# Patient Record
Sex: Female | Born: 1985 | Race: White | Hispanic: No | Marital: Married | Smoking: Never smoker
Health system: Southern US, Community
[De-identification: ages and names within clinical notes are randomized; demographics above are authoritative.]

## PROBLEM LIST (undated history)

## (undated) DIAGNOSIS — E282 Polycystic ovarian syndrome: Secondary | ICD-10-CM

## (undated) DIAGNOSIS — R569 Unspecified convulsions: Secondary | ICD-10-CM

## (undated) DIAGNOSIS — O24419 Gestational diabetes mellitus in pregnancy, unspecified control: Secondary | ICD-10-CM

## (undated) HISTORY — PX: NO PAST SURGERIES: SHX2092

## (undated) HISTORY — DX: Gestational diabetes mellitus in pregnancy, unspecified control: O24.419

---

## 2015-11-09 ENCOUNTER — Ambulatory Visit
Admission: EM | Admit: 2015-11-09 | Discharge: 2015-11-09 | Disposition: A | Discharge status: Home / Self Care | Payer: BLUE CROSS/BLUE SHIELD | Attending: Family Medicine | Admitting: Family Medicine

## 2015-11-09 DIAGNOSIS — B2790 Infectious mononucleosis, unspecified without complication: Secondary | ICD-10-CM

## 2015-11-09 HISTORY — DX: Polycystic ovarian syndrome: E28.2

## 2015-11-09 LAB — MONONUCLEOSIS SCREEN: MONO SCREEN: NEGATIVE

## 2015-11-09 LAB — RAPID STREP SCREEN (MED CTR MEBANE ONLY): STREPTOCOCCUS, GROUP A SCREEN (DIRECT): NEGATIVE

## 2015-11-09 MED ORDER — ACYCLOVIR 200 MG PO CAPS: 200.0000 mg | ORAL_CAPSULE | Freq: Two times a day (BID) | ORAL | 0 refills | Status: DC

## 2015-11-09 NOTE — ED Provider Notes (Signed)
CSN: [id number]     Arrival date & time 11/09/15  [id number] History   First MD Initiated Contact with Patient 11/09/15 [phone]     Chief Complaint  Patient presents with  . Fatigue   (Consider location/radiation/quality/duration/timing/severity/associated sxs/prior Treatment) For 3 days now pt feels tired, sore throat, generalize body aches. 3 other family members have recently been tested positive for mono. She has been running a fever took tyelnol PTA. Denies any abd pain, no n/v/d. Denies any rash.    The history is provided by the patient.    Past Medical History:  Diagnosis Date  . PCOS (polycystic ovarian syndrome)    Past Surgical History:  Procedure Laterality Date  . NO PAST SURGERIES     History reviewed. No pertinent family history. Social History  Substance Use Topics  . Smoking status: Never Smoker  . Smokeless tobacco: Never Used  . Alcohol use 1.2 oz/week    2 Glasses of wine per week   OB History    No data available     Review of Systems  Constitutional: Positive for chills and fever.  HENT: Positive for ear pain, sinus pressure and sore throat.   Eyes: Negative.   Respiratory: Negative.   Cardiovascular: Negative.   Gastrointestinal: Negative.   Genitourinary: Negative.   Musculoskeletal:       Generalized body aches   Skin: Negative.   Neurological: Positive for weakness.       Feels weak and tired.     Allergies  Red dye  Home Medications   Prior to Admission medications   Medication Sig Start Date End Date Taking? Authorizing Provider  acyclovir (ZOVIRAX) 200 MG capsule Take 1 capsule (200 mg total) by mouth 2 (two) times daily. 11/09/15   Tobi BastosMelanie A Fanta Wimberley, NP   Meds Ordered and Administered this Visit  Medications - No data to display  BP 93/69 (BP Location: Left Arm)   Pulse (!) 111   Temp 99.4 F (37.4 C) (Oral)   Resp 17   Ht 5\' 4"  (1.626 m)   Wt 265 lb (120.2 kg)   LMP 06/04/2015 Comment: PCOS  SpO2 99%   BMI 45.49 kg/m  No data  found.   Physical Exam  Constitutional: She is oriented to person, place, and time. She appears well-developed and well-nourished.  HENT:  Sore throat , erythema,   Eyes: EOM are normal. Pupils are equal, round, and reactive to light.  Neck: Normal range of motion.  Cardiovascular: Normal rate and regular rhythm.   Pulmonary/Chest: Effort normal and breath sounds normal.  Abdominal: Soft. Bowel sounds are normal.  Musculoskeletal: Normal range of motion.  Neurological: She is alert and oriented to person, place, and time.  Skin: Skin is warm and dry.    Urgent Care Course   Clinical Course    Procedures (including critical care time)  Labs Review Labs Reviewed  RAPID STRE[facility] SCREEN (NOT [facility])  CULTURE, GROUP A STREP [facility])  MONONUCLEOSIS SCREEN    Imaging Review No results found.   Visual Acuity Review  Right Eye Distance:   Left Eye Distance:   Bilateral Distance:    Right Eye Near:   Left Eye Near:    Bilateral Near:         MDM   1. Mononucleosis    - We will obtain labs for mono and will call if test are positive. Can take antiviral medications but may only help minimally.  - Continue to take tylenol and  motrin for fever for pain.  -Push fluids, avoid contact with others x3-5 days -Treat symptoms and it may take 2 weeks to feel better.  -If you begin to have worsening symptoms for the ER     Tobi Bastos, NP 11/09/15 1203

## 2015-11-09 NOTE — ED Triage Notes (Signed)
Patient states that she has had extreme fatigue over the last 2-3 days. Patient states that she has been having a sore throat and fevers. Patient states that she has multiple family members who are Positive for Mono.

## 2015-11-12 LAB — CULTURE, GROUP A STREP (THRC)

## 2015-12-02 ENCOUNTER — Ambulatory Visit (INDEPENDENT_AMBULATORY_CARE_PROVIDER_SITE_OTHER): Payer: BLUE CROSS/BLUE SHIELD | Admitting: Obstetrics and Gynecology

## 2015-12-02 ENCOUNTER — Encounter: Payer: Self-pay | Admitting: Obstetrics and Gynecology

## 2015-12-02 ENCOUNTER — Other Ambulatory Visit (INDEPENDENT_AMBULATORY_CARE_PROVIDER_SITE_OTHER): Payer: BLUE CROSS/BLUE SHIELD

## 2015-12-02 VITALS — BP 129/76 | HR 108 | Ht 64.0 in | Wt 266.9 lb

## 2015-12-02 DIAGNOSIS — N926 Irregular menstruation, unspecified: Secondary | ICD-10-CM

## 2015-12-02 LAB — POCT URINE PREGNANCY: Preg Test, Ur: POSITIVE — AB

## 2015-12-02 NOTE — Progress Notes (Signed)
Subjective:     Patient ID: Brooke Fuller, female   DOB: 1985-12-31, [age] y.o.   MRN: [id number]  HPI Nausea and vomiting x 1 week with + UPT 2 weeks ago. H/O PCOS and irregular menses, with LMP this March. Had breast tenderness x 10 days. Happy about unplanned pregnancy as she was told she could never get pregnant. Has a 7917 month old adopted son.  Review of Systems See above    Objective:   Physical Exam   A& O x4  well groomed obese female in no distress  Blood pressure 129/76, pulse (!) 108, height 5\' 4"  (1.626 m), weight 266 lb 14.4 oz (121.1 kg), last menstrual period 06/04/2015. UPT+  Ultrasound Findings:  Singleton intrauterine pregnancy is visualized with a CRL consistent with 6 2/[redacted] weeks gestation, giving an (U/S) EDD of 07/25/16.  Unsure of LMP  FHR: 121 CRL measurement: 5 mm Yolk sac and and early anatomy is normal.  Right Ovary measures 3.3 x 2.2 x 2.1  cm. It is normal in appearance. Left Ovary measures 2.9 x 2 x 2.7 cm. It is normal appearance. There is evidence of a corpus luteal cyst in the Left Survey of the adnexa demonstrates no adnexal masses. There is no free peritoneal fluid in the cul de sac.      Assessment:     Impression: 1. 6 2/7 week Viable Singleton Intrauterine pregnancy by U/S. 2. (U/S) EDD is 07/25/16. PCOS Morbid obesity Nausea and vomiting      Plan:     RTC in 2 weeks for nurse intake and labs Diclegis samples given, along with Pixie PNV  Lenzie Montesano Aura CampsShambley, CNM

## 2015-12-09 ENCOUNTER — Telehealth: Payer: Self-pay | Admitting: Obstetrics and Gynecology

## 2015-12-09 NOTE — Telephone Encounter (Signed)
Pt was given a sample of Diclegis and would like a RX sent ([facility] [employer])

## 2015-12-09 NOTE — Telephone Encounter (Signed)
Coupon was put up front

## 2015-12-10 ENCOUNTER — Other Ambulatory Visit: Payer: Self-pay | Admitting: *Deleted

## 2015-12-10 MED ORDER — DOXYLAMINE-PYRIDOXINE 10-10 MG PO TBEC: 2.0000 | DELAYED_RELEASE_TABLET | Freq: Every evening | ORAL | 2 refills | Status: DC | PRN

## 2015-12-16 ENCOUNTER — Ambulatory Visit (INDEPENDENT_AMBULATORY_CARE_PROVIDER_SITE_OTHER): Payer: BLUE CROSS/BLUE SHIELD | Admitting: Obstetrics and Gynecology

## 2015-12-16 VITALS — BP 115/73 | HR 85 | Wt 265.4 lb

## 2015-12-16 DIAGNOSIS — Z87898 Personal history of other specified conditions: Secondary | ICD-10-CM

## 2015-12-16 DIAGNOSIS — Z36 Encounter for antenatal screening of mother: Secondary | ICD-10-CM

## 2015-12-16 DIAGNOSIS — Z113 Encounter for screening for infections with a predominantly sexual mode of transmission: Secondary | ICD-10-CM

## 2015-12-16 DIAGNOSIS — Z1389 Encounter for screening for other disorder: Secondary | ICD-10-CM

## 2015-12-16 DIAGNOSIS — R638 Other symptoms and signs concerning food and fluid intake: Secondary | ICD-10-CM

## 2015-12-16 DIAGNOSIS — Z8669 Personal history of other diseases of the nervous system and sense organs: Secondary | ICD-10-CM

## 2015-12-16 DIAGNOSIS — J45909 Unspecified asthma, uncomplicated: Secondary | ICD-10-CM

## 2015-12-16 DIAGNOSIS — Z3401 Encounter for supervision of normal first pregnancy, first trimester: Secondary | ICD-10-CM

## 2015-12-16 DIAGNOSIS — Z369 Encounter for antenatal screening, unspecified: Secondary | ICD-10-CM

## 2015-12-16 DIAGNOSIS — Z3490 Encounter for supervision of normal pregnancy, unspecified, unspecified trimester: Secondary | ICD-10-CM

## 2015-12-16 DIAGNOSIS — T7589XA Other specified effects of external causes, initial encounter: Secondary | ICD-10-CM

## 2015-12-16 NOTE — Patient Instructions (Signed)
Pregnancy and Zika Virus Disease Zika virus disease, or Zika, is an illness that can spread to people from mosquitoes that carry the virus. It may also spread from person to person through infected body fluids. Zika first occurred in [place], but recently it has spread to new areas. The virus occurs in tropical climates. The location of Zika continues to change. Most people who become infected with Zika virus do not develop serious illness. However, Zika may cause birth defects in an unborn baby whose mother is infected with the virus. It may also increase the risk of miscarriage. WHAT ARE THE SYMPTOMS OF ZIKA VIRUS DISEASE? In many cases, people who have been infected with Zika virus do not develop any symptoms. If symptoms appear, they usually start about a week after the person is infected. Symptoms are usually mild. They may include:  Fever.  Rash.  Red eyes.  Joint pain. HOW DOES ZIKA VIRUS DISEASE SPREAD? The main way that Zika virus spreads is through the bite of a certain type of mosquito. Unlike most types of mosquitos, which bite only at night, the type of mosquito that carries Zika virus bites both at night and during the day. Zika virus can also spread through sexual contact, through a blood transfusion, and from a mother to her baby before or during birth. Once you have had Zika virus disease, it is unlikely that you will get it again. CAN I PASS ZIKA TO MY BABY DURING PREGNANCY? Yes, Zika can pass from a mother to her baby before or during birth. WHAT PROBLEMS CAN ZIKA CAUSE FOR MY BABY? A woman who is infected with Zika virus while pregnant is at risk of having her baby born with a condition in which the brain or head is smaller than expected (microcephaly). Babies who have microcephaly can have developmental delays, seizures, hearing problems, and vision problems. Having Zika virus disease during pregnancy can also increase the risk of miscarriage. HOW CAN ZIKA VIRUS DISEASE BE  PREVENTED? There is no vaccine to prevent Zika. The best way to prevent the disease is to avoid infected mosquitoes and avoid exposure to body fluids that can spread the virus. Avoid any possible exposure to Zika by taking the following precautions. For women and their sex partners:  Avoid traveling to high-risk areas. The locations where Zika is being reported change often. To identify high-risk areas, check the CDC travel website: www.cdc.gov/zika/geo/index.html  If you or your sex partner must travel to a high-risk area, talk with a health care provider before and after traveling.  Take all precautions to avoid mosquito bites if you live in, or travel to, any of the high-risk areas. Insect repellents are safe to use during pregnancy.  Ask your health care provider when it is safe to have sexual contact. For women:  If you are pregnant or trying to become pregnant, avoid sexual contact with persons who may have been exposed to Zika virus, persons who have possible symptoms of Zika, or persons whose history you are unsure about. If you choose to have sexual contact with someone who may have been exposed to Zika virus, use condoms correctly during the entire duration of sexual activity, every time. Do not share sexual devices, as you may be exposed to body fluids.  Ask your health care provider about when it is safe to attempt pregnancy after a possible exposure to Zika virus. WHAT STEPS SHOULD I TAKE TO AVOID MOSQUITO BITES? Take these steps to avoid mosquito bites when you are   in a high-risk area:  Wear loose clothing that covers your arms and legs.  Limit your outdoor activities.  Do not open windows unless they have window screens.  Sleep under mosquito nets.  Use insect repellent. The best insect repellents have:  DEET, picaridin, oil of lemon eucalyptus (OLE), or IR3535 in them.  Higher amounts of an active ingredient in them.  Remember that insect repellents are safe to use  during pregnancy.  Do not use OLE on children who are younger than 3 years of age. Do not use insect repellent on babies who are younger than 2 months of age.  Cover your child's stroller with mosquito netting. Make sure the netting fits snugly and that any loose netting does not cover your child's mouth or nose. Do not use a blanket as a mosquito-protection cover.  Do not apply insect repellent underneath clothing.  If you are using sunscreen, apply the sunscreen before applying the insect repellent.  Treat clothing with permethrin. Do not apply permethrin directly to your skin. Follow label directions for safe use.  Get rid of standing water, where mosquitoes may reproduce. Standing water is often found in items such as buckets, bowls, animal food dishes, and flowerpots. When you return from traveling to any high-risk area, continue taking actions to protect yourself against mosquito bites for 3 weeks, even if you show no signs of illness. This will prevent spreading Zika virus to uninfected mosquitoes. WHAT SHOULD I KNOW ABOUT THE SEXUAL TRANSMISSION OF ZIKA? People can spread Zika to their sexual partners during vaginal, anal, or oral sex, or by sharing sexual devices. Many people with Zika do not develop symptoms, so a person could spread the disease without knowing that they are infected. The greatest risk is to women who are pregnant or who may become pregnant. Zika virus can live longer in semen than it can live in blood. Couples can prevent sexual transmission of the virus by:  Using condoms correctly during the entire duration of sexual activity, every time. This includes vaginal, anal, and oral sex.  Not sharing sexual devices. Sharing increases your risk of being exposed to body fluid from another person.  Avoiding all sexual activity until your health care provider says it is safe. SHOULD I BE TESTED FOR ZIKA VIRUS? A sample of your blood can be tested for Zika virus. A pregnant  woman should be tested if she may have been exposed to the virus or if she has symptoms of Zika. She may also have additional tests done during her pregnancy, such ultrasound testing. Talk with your health care provider about which tests are recommended.   This information is not intended to replace advice given to you by your health care provider. Make sure you discuss any questions you have with your health care provider.   Document Released: 12/11/2014 Document Reviewed: 12/04/2014 Elsevier Interactive Patient Education 2016 Elsevier Inc. Minor Illnesses and Medications in Pregnancy  Cold/Flu:  Sudafed for congestion- Robitussin (plain) for cough- Tylenol for discomfort.  Please follow the directions on the label.  Try not to take any more than needed.  OTC Saline nasal spray and air humidifier or cool-mist  Vaporizer to sooth nasal irritation and to loosen congestion.  It is also important to increase intake of non carbonated fluids, especially if you have a fever.  Constipation:  Colace-2 capsules at bedtime; Metamucil- follow directions on label; Senokot- 1 tablet at bedtime.  Any one of these medications can be used.  It is also   very important to increase fluids and fruits along with regular exercise.  If problem persists please call the office.  Diarrhea:  Kaopectate as directed on the label.  Eat a bland diet and increase fluids.  Avoid highly seasoned foods.  Headache:  Tylenol 1 or 2 tablets every 3-4 hours as needed  Indigestion:  Maalox, Mylanta, Tums or Rolaids- as directed on label.  Also try to eat small meals and avoid fatty, greasy or spicy foods.  Nausea with or without Vomiting:  Nausea in pregnancy is caused by increased levels of hormones in the body which influence the digestive system and cause irritation when stomach acids accumulate.  Symptoms usually subside after 1st trimester of pregnancy.  Try the following: 1. Keep saltines, graham crackers or dry toast by your bed  to eat upon awakening. 2. Don't let your stomach get empty.  Try to eat 5-6 small meals per day instead of 3 large ones. 3. Avoid greasy fatty or highly seasoned foods.  4. Take OTC Unisom 1 tablet at bed time along with OTC Vitamin B6 25-50 mg 3 times per day.    If nausea continues with vomiting and you are unable to keep down food and fluids you may need a prescription medication.  Please notify your provider.   Sore throat:  Chloraseptic spray, throat lozenges and or plain Tylenol.  Vaginal Yeast Infection:  OTC Monistat for 7 days as directed on label.  If symptoms do not resolve within a week notify provider.  If any of the above problems do not subside with recommended treatment please call the office for further assistance.   Do not take Aspirin, Advil, Motrin or Ibuprofen.  * * OTC= Over the counter Hyperemesis Gravidarum Hyperemesis gravidarum is a severe form of nausea and vomiting that happens during pregnancy. Hyperemesis is worse than morning sickness. It may cause you to have nausea or vomiting all day for many days. It may keep you from eating and drinking enough food and liquids. Hyperemesis usually occurs during the first half (the first 20 weeks) of pregnancy. It often goes away once a woman is in her second half of pregnancy. However, sometimes hyperemesis continues through an entire pregnancy.  CAUSES  The cause of this condition is not completely known but is thought to be related to changes in the body's hormones when pregnant. It could be from the high level of the pregnancy hormone or an increase in estrogen in the body.  SIGNS AND SYMPTOMS   Severe nausea and vomiting.  Nausea that does not go away.  Vomiting that does not allow you to keep any food down.  Weight loss and body fluid loss (dehydration).  Having no desire to eat or not liking food you have previously enjoyed. DIAGNOSIS  Your health care provider will do a physical exam and ask you about your  symptoms. He or she may also order blood tests and urine tests to make sure something else is not causing the problem.  TREATMENT  You may only need medicine to control the problem. If medicines do not control the nausea and vomiting, you will be treated in the hospital to prevent dehydration, increased acid in the blood (acidosis), weight loss, and changes in the electrolytes in your body that may harm the unborn baby (fetus). You may need IV fluids.  HOME CARE INSTRUCTIONS   Only take over-the-counter or prescription medicines as directed by your health care provider.  Try eating a couple of dry crackers or   toast in the morning before getting out of bed.  Avoid foods and smells that upset your stomach.  Avoid fatty and spicy foods.  Eat 5-6 small meals a day.  Do not drink when eating meals. Drink between meals.  For snacks, eat high-protein foods, such as cheese.  Eat or suck on things that have ginger in them. Ginger helps nausea.  Avoid food preparation. The smell of food can spoil your appetite.  Avoid iron pills and iron in your multivitamins until after 3-4 months of being pregnant. However, consult with your health care provider before stopping any prescribed iron pills. SEEK MEDICAL CARE IF:   Your abdominal pain increases.  You have a severe headache.  You have vision problems.  You are losing weight. SEEK IMMEDIATE MEDICAL CARE IF:   You are unable to keep fluids down.  You vomit blood.  You have constant nausea and vomiting.  You have excessive weakness.  You have extreme thirst.  You have dizziness or fainting.  You have a fever or persistent symptoms for more than 2-3 days.  You have a fever and your symptoms suddenly get worse. MAKE SURE YOU:   Understand these instructions.  Will watch your condition.  Will get help right away if you are not doing well or get worse.   This information is not intended to replace advice given to you by your  health care provider. Make sure you discuss any questions you have with your health care provider.   Document Released: 03/22/2005 Document Revised: 01/10/2013 Document Reviewed: 11/01/2012 Elsevier Interactive Patient Education 2016 Elsevier Inc. Commonly Asked Questions During Pregnancy  Cats: A parasite can be excreted in cat feces.  To avoid exposure you need to have another person empty the little box.  If you must empty the litter box you will need to wear gloves.  Wash your hands after handling your cat.  This parasite can also be found in raw or undercooked meat so this should also be avoided.  Colds, Sore Throats, Flu: Please check your medication sheet to see what you can take for symptoms.  If your symptoms are unrelieved by these medications please call the office.  Dental Work: Most any dental work your dentist recommends is permitted.  X-rays should only be taken during the first trimester if absolutely necessary.  Your abdomen should be shielded with a lead apron during all x-rays.  Please notify your provider prior to receiving any x-rays.  Novocaine is fine; gas is not recommended.  If your dentist requires a note from us prior to dental work please call the office and we will provide one for you.  Exercise: Exercise is an important part of staying healthy during your pregnancy.  You may continue most exercises you were accustomed to prior to pregnancy.  Later in your pregnancy you will most likely notice you have difficulty with activities requiring balance like riding a bicycle.  It is important that you listen to your body and avoid activities that put you at a higher risk of falling.  Adequate rest and staying well hydrated are a must!  If you have questions about the safety of specific activities ask your provider.    Exposure to Children with illness: Try to avoid obvious exposure; report any symptoms to us when noted,  If you have chicken pos, red measles or mumps, you should  be immune to these diseases.   Please do not take any vaccines while pregnant unless you have checked with   your OB provider.  Fetal Movement: After 28 weeks we recommend you do "kick counts" twice daily.  Lie or sit down in a calm quiet environment and count your baby movements "kicks".  You should feel your baby at least 10 times per hour.  If you have not felt 10 kicks within the first hour get up, walk around and have something sweet to eat or drink then repeat for an additional hour.  If count remains less than 10 per hour notify your provider.  Fumigating: Follow your pest control agent's advice as to how long to stay out of your home.  Ventilate the area well before re-entering.  Hemorrhoids:   Most over-the-counter preparations can be used during pregnancy.  Check your medication to see what is safe to use.  It is important to use a stool softener or fiber in your diet and to drink lots of liquids.  If hemorrhoids seem to be getting worse please call the office.   Hot Tubs:  Hot tubs Jacuzzis and saunas are not recommended while pregnant.  These increase your internal body temperature and should be avoided.  Intercourse:  Sexual intercourse is safe during pregnancy as long as you are comfortable, unless otherwise advised by your provider.  Spotting may occur after intercourse; report any bright red bleeding that is heavier than spotting.  Labor:  If you know that you are in labor, please go to the hospital.  If you are unsure, please call the office and let us help you decide what to do.  Lifting, straining, etc:  If your job requires heavy lifting or straining please check with your provider for any limitations.  Generally, you should not lift items heavier than that you can lift simply with your hands and arms (no back muscles)  Painting:  Paint fumes do not harm your pregnancy, but may make you ill and should be avoided if possible.  Latex or water based paints have less odor than oils.   Use adequate ventilation while painting.  Permanents & Hair Color:  Chemicals in hair dyes are not recommended as they cause increase hair dryness which can increase hair loss during pregnancy.  " Highlighting" and permanents are allowed.  Dye may be absorbed differently and permanents may not hold as well during pregnancy.  Sunbathing:  Use a sunscreen, as skin burns easily during pregnancy.  Drink plenty of fluids; avoid over heating.  Tanning Beds:  Because their possible side effects are still unknown, tanning beds are not recommended.  Ultrasound Scans:  Routine ultrasounds are performed at approximately 20 weeks.  You will be able to see your baby's general anatomy an if you would like to know the gender this can usually be determined as well.  If it is questionable when you conceived you may also receive an ultrasound early in your pregnancy for dating purposes.  Otherwise ultrasound exams are not routinely performed unless there is a medical necessity.  Although you can request a scan we ask that you pay for it when conducted because insurance does not cover " patient request" scans.  Work: If your pregnancy proceeds without complications you may work until your due date, unless your physician or employer advises otherwise.  Round Ligament Pain/Pelvic Discomfort:  Sharp, shooting pains not associated with bleeding are fairly common, usually occurring in the second trimester of pregnancy.  They tend to be worse when standing up or when you remain standing for long periods of time.  These are the result   of pressure of certain pelvic ligaments called "round ligaments".  Rest, Tylenol and heat seem to be the most effective relief.  As the womb and fetus grow, they rise out of the pelvis and the discomfort improves.  Please notify the office if your pain seems different than that described.  It may represent a more serious condition.   

## 2015-12-16 NOTE — Progress Notes (Signed)
Wilhemena DurieAlison Gergen presents for NOB nurse interview visit. G-1. P-0. Has adopted son. Pregnancy confirmed by [facility]. Ultrasound results from 12/02/2015 gave EDD: 07/25/2016.  Pregnancy education material explained and given. Two cats in the home. Will do screening for toxoplasma.  NOB labs ordered. TSH/HbgA1c due to Increased BMI. HIV labs and Drug screen were explained optional and she could opt out of tests but did not decline. Drug screen ordered. PNV encouraged. Genetic test to discuss with provider. Pt. To follow up with provider in 2 weeks for NOB physical as scheduled.   All questions answered.

## 2015-12-25 ENCOUNTER — Other Ambulatory Visit: Payer: BLUE CROSS/BLUE SHIELD

## 2015-12-25 DIAGNOSIS — Z3490 Encounter for supervision of normal pregnancy, unspecified, unspecified trimester: Secondary | ICD-10-CM

## 2015-12-25 DIAGNOSIS — Z369 Encounter for antenatal screening, unspecified: Secondary | ICD-10-CM

## 2015-12-25 NOTE — Addendum Note (Signed)
Addended by: Lunette StandsSIEMIENSKI, Matheau Orona J on: 12/25/2015 11:11 AM   Modules accepted: Orders

## 2015-12-26 LAB — MONITOR DRUG PROFILE 14(MW)
AMPHETAMINE SCREEN URINE: NEGATIVE ng/mL
BARBITURATE SCREEN URINE: NEGATIVE ng/mL
BENZODIAZEPINE SCREEN, URINE: NEGATIVE ng/mL
Buprenorphine, Urine: NEGATIVE ng/mL
CANNABINOIDS UR QL SCN: NEGATIVE ng/mL
Cocaine (Metab) Scrn, Ur: NEGATIVE ng/mL
Creatinine(Crt), U: 176.9 mg/dL (ref 20.0–300.0)
FENTANYL, URINE: NEGATIVE pg/mL
Meperidine Screen, Urine: NEGATIVE ng/mL
Methadone Screen, Urine: NEGATIVE ng/mL
OXYCODONE+OXYMORPHONE UR QL SCN: NEGATIVE ng/mL
Opiate Scrn, Ur: NEGATIVE ng/mL
PH UR, DRUG SCRN: 7.6 (ref 4.5–8.9)
PHENCYCLIDINE QUANTITATIVE URINE: NEGATIVE ng/mL
Propoxyphene Scrn, Ur: NEGATIVE ng/mL
SPECIFIC GRAVITY: 1.021
Tramadol Screen, Urine: NEGATIVE ng/mL

## 2015-12-26 LAB — MICROSCOPIC EXAMINATION: Casts: NONE SEEN /lpf

## 2015-12-26 LAB — URINALYSIS, ROUTINE W REFLEX MICROSCOPIC
Bilirubin, UA: NEGATIVE
GLUCOSE, UA: NEGATIVE
KETONES UA: NEGATIVE
NITRITE UA: NEGATIVE
Protein, UA: NEGATIVE
SPEC GRAV UA: 1.022 (ref 1.005–1.030)
Urobilinogen, Ur: 0.2 mg/dL (ref 0.2–1.0)
pH, UA: 7.5 (ref 5.0–7.5)

## 2015-12-26 LAB — ABO

## 2015-12-26 LAB — NICOTINE SCREEN, URINE: Cotinine Ql Scrn, Ur: NEGATIVE ng/mL

## 2015-12-27 LAB — URINE CULTURE, OB REFLEX

## 2015-12-27 LAB — CBC WITH DIFFERENTIAL/PLATELET
BASOS ABS: 0 10*3/uL (ref 0.0–0.2)
BASOS: 0 %
EOS (ABSOLUTE): 0.1 10*3/uL (ref 0.0–0.4)
EOS: 1 %
HEMATOCRIT: 38.4 % (ref 34.0–46.6)
HEMOGLOBIN: 13 g/dL (ref 11.1–15.9)
IMMATURE GRANS (ABS): 0 10*3/uL (ref 0.0–0.1)
Immature Granulocytes: 1 %
LYMPHS: 19 %
Lymphocytes Absolute: 1.5 10*3/uL (ref 0.7–3.1)
MCH: 28.6 pg (ref 26.6–33.0)
MCHC: 33.9 g/dL (ref 31.5–35.7)
MCV: 84 fL (ref 79–97)
MONOCYTES: 6 %
Monocytes Absolute: 0.5 10*3/uL (ref 0.1–0.9)
NEUTROS ABS: 5.8 10*3/uL (ref 1.4–7.0)
Neutrophils: 73 %
Platelets: 223 10*3/uL (ref 150–379)
RBC: 4.55 x10E6/uL (ref 3.77–5.28)
RDW: 14.4 % (ref 12.3–15.4)
WBC: 8 10*3/uL (ref 3.4–10.8)

## 2015-12-27 LAB — TOXOPLASMA ANTIBODIES- IGG AND  IGM
Toxoplasma Antibody- IgM: <3 AU/mL (ref 0.0–7.9)
Toxoplasma IgG Ratio: <3 IU/mL (ref 0.0–7.1)

## 2015-12-27 LAB — RUBELLA SCREEN: Rubella Antibodies, IGG: 0.97 index — ABNORMAL LOW (ref >0.99)

## 2015-12-27 LAB — ANTIBODY SCREEN: Antibody Screen: NEGATIVE

## 2015-12-27 LAB — HIV ANTIBODY (ROUTINE TESTING W REFLEX): HIV Screen 4th Generation wRfx: NONREACTIVE

## 2015-12-27 LAB — TSH: TSH: 1.17 u[IU]/mL (ref 0.450–4.500)

## 2015-12-27 LAB — RPR: RPR: NONREACTIVE

## 2015-12-27 LAB — HEPATITIS B SURFACE ANTIGEN: HEP B S AG: NEGATIVE

## 2015-12-27 LAB — RH TYPE: RH TYPE: POSITIVE

## 2015-12-27 LAB — GC/CHLAMYDIA PROBE AMP
Chlamydia trachomatis, NAA: NEGATIVE
Neisseria gonorrhoeae by PCR: NEGATIVE

## 2015-12-27 LAB — HEMOGLOBIN A1C
ESTIMATED AVERAGE GLUCOSE: 108 mg/dL
HEMOGLOBIN A1C: 5.4 % (ref 4.8–5.6)

## 2015-12-27 LAB — VARICELLA ZOSTER ANTIBODY, IGG: VARICELLA: 651 {index} (ref >165)

## 2015-12-27 LAB — CULTURE, OB URINE

## 2015-12-30 ENCOUNTER — Ambulatory Visit (INDEPENDENT_AMBULATORY_CARE_PROVIDER_SITE_OTHER): Payer: BLUE CROSS/BLUE SHIELD | Admitting: Obstetrics and Gynecology

## 2015-12-30 ENCOUNTER — Encounter: Payer: Self-pay | Admitting: Obstetrics and Gynecology

## 2015-12-30 ENCOUNTER — Other Ambulatory Visit: Payer: Self-pay | Admitting: Obstetrics and Gynecology

## 2015-12-30 VITALS — BP 119/76 | HR 99 | Wt 265.5 lb

## 2015-12-30 DIAGNOSIS — O9989 Other specified diseases and conditions complicating pregnancy, childbirth and the puerperium: Secondary | ICD-10-CM

## 2015-12-30 DIAGNOSIS — Z3491 Encounter for supervision of normal pregnancy, unspecified, first trimester: Secondary | ICD-10-CM

## 2015-12-30 DIAGNOSIS — Z8669 Personal history of other diseases of the nervous system and sense organs: Secondary | ICD-10-CM

## 2015-12-30 DIAGNOSIS — Z6841 Body Mass Index (BMI) 40.0 and over, adult: Secondary | ICD-10-CM

## 2015-12-30 DIAGNOSIS — Z87898 Personal history of other specified conditions: Secondary | ICD-10-CM

## 2015-12-30 DIAGNOSIS — Z283 Underimmunization status: Secondary | ICD-10-CM

## 2015-12-30 DIAGNOSIS — Z2839 Other underimmunization status: Secondary | ICD-10-CM

## 2015-12-30 LAB — POCT URINALYSIS DIPSTICK
BILIRUBIN UA: NEGATIVE
Glucose, UA: NEGATIVE
KETONES UA: NEGATIVE
Nitrite, UA: NEGATIVE
PH UA: 6
Protein, UA: NEGATIVE
SPEC GRAV UA: 1.015
Urobilinogen, UA: 0.2

## 2015-12-30 NOTE — Progress Notes (Signed)
NEW OB HISTORY AND PHYSICAL  SUBJECTIVE:       Brooke Fuller is a 30 y.o. G1P0 female, Patient's last menstrual period was 06/04/2015., Estimated Date of Delivery: 07/25/16, [redacted]w[redacted]d, presents today for establishment of Prenatal Care. She has no unusual complaints and complains of nausea with vomiting for last 5 weeks, relieved somewhat with diclegis.      Gynecologic History Patient's last menstrual period was 06/04/2015. Unknown Contraception: none Last Pap: July 2016. Results were: normal  Obstetric History OB History  Gravida Para Term Preterm AB Living  1            SAB TAB Ectopic Multiple Live Births               # Outcome Date GA Lbr Len/2nd Weight Sex Delivery Anes PTL Lv  1 Current             Obstetric Comments  Adopted son. 30month now    Past Medical History:  Diagnosis Date  . PCOS (polycystic ovarian syndrome)   . PCOS (polycystic ovarian syndrome)     Past Surgical History:  Procedure Laterality Date  . NO PAST SURGERIES      Current Outpatient Prescriptions on File Prior to Visit  Medication Sig Dispense Refill  . Doxylamine-Pyridoxine (DICLEGIS) 10-10 MG TBEC Take 2 tablets by mouth at bedtime as needed. 60 tablet 2  . Prenatal Vit-Fe Fumarate-FA (PRENATAL MULTIVITAMIN) TABS tablet Take 1 tablet by mouth daily at 12 noon.     No current facility-administered medications on file prior to visit.     Allergies  Allergen Reactions  . Red Dye     Social History   Social History  . Marital status: Married    Spouse name: N/A  . Number of children: N/A  . Years of education: N/A   Occupational History  . Not on file.   Social History Main Topics  . Smoking status: Never Smoker  . Smokeless tobacco: Never Used  . Alcohol use 1.2 oz/week    2 Glasses of wine per week  . Drug use: No  . Sexual activity: Yes   Other Topics Concern  . Not on file   Social History Narrative  . No narrative on file    Family History  Problem Relation Age  of Onset  . Alzheimer's disease Mother   . Seizures Mother   . Cancer Maternal Grandmother   . Heart failure Maternal Grandfather   . Hypertension Maternal Grandfather   . Stroke Maternal Grandfather     The following portions of the patient's history were reviewed and updated as appropriate: allergies, current medications, past OB history, past medical history, past surgical history, past family history, past social history, and problem list.    OBJECTIVE: Initial Physical Exam (New OB)  GENERAL APPEARANCE: alert, well appearing, in no apparent distress, oriented to person, place and time HEAD: normocephalic, atraumatic MOUTH: mucous membranes moist, pharynx normal without lesions and dental hygiene good THYROID: no thyromegaly or masses present BREASTS: not examined LUNGS: clear to auscultation, no wheezes, rales or rhonchi, symmetric air entry HEART: regular rate and rhythm, no murmurs ABDOMEN: soft, nontender, nondistended, no abnormal masses, no epigastric pain, fundus not palpable and FHT present EXTREMITIES: no redness or tenderness in the calves or thighs SKIN: normal coloration and turgor, no rashes LYMPH NODES: no adenopathy palpable NEUROLOGIC: alert, oriented, normal speech, no focal findings or movement disorder noted  PELVIC EXAM not indicated  ASSESSMENT: Normal pregnancy Morbid obesity  PLAN: Prenatal care Early glucola next visit, will plan anesthesia consult in second trimester Genetic testing desired (unknown paternal grandfather history due to adoption) See orders

## 2015-12-30 NOTE — Progress Notes (Signed)
NOB- pt is doing ok, some nausea is taking diclegis

## 2015-12-30 NOTE — Patient Instructions (Signed)

## 2016-01-12 ENCOUNTER — Encounter: Payer: Self-pay | Admitting: Obstetrics and Gynecology

## 2016-01-13 ENCOUNTER — Telehealth: Payer: Self-pay | Admitting: Obstetrics and Gynecology

## 2016-01-13 ENCOUNTER — Other Ambulatory Visit: Payer: Self-pay | Admitting: Obstetrics and Gynecology

## 2016-01-13 NOTE — Telephone Encounter (Signed)
Called pt she was notified of results

## 2016-01-13 NOTE — Telephone Encounter (Signed)
Pt called and she was wondering about the results of her Panorma.

## 2016-01-16 ENCOUNTER — Encounter: Payer: Self-pay | Admitting: Obstetrics and Gynecology

## 2016-01-19 ENCOUNTER — Emergency Department
Admission: EM | Admit: 2016-01-19 | Discharge: 2016-01-19 | Disposition: A | Discharge status: Home / Self Care | Payer: BLUE CROSS/BLUE SHIELD | Attending: Emergency Medicine | Admitting: Emergency Medicine

## 2016-01-19 ENCOUNTER — Encounter: Payer: Self-pay | Admitting: Emergency Medicine

## 2016-01-19 DIAGNOSIS — O219 Vomiting of pregnancy, unspecified: Secondary | ICD-10-CM | POA: Diagnosis not present

## 2016-01-19 DIAGNOSIS — J45909 Unspecified asthma, uncomplicated: Secondary | ICD-10-CM | POA: Insufficient documentation

## 2016-01-19 DIAGNOSIS — Z3A13 13 weeks gestation of pregnancy: Secondary | ICD-10-CM | POA: Insufficient documentation

## 2016-01-19 HISTORY — DX: Unspecified convulsions: R56.9

## 2016-01-19 LAB — CBC
HEMATOCRIT: 38.7 % (ref 35.0–47.0)
Hemoglobin: 13.7 g/dL (ref 12.0–16.0)
MCH: 29.8 pg (ref 26.0–34.0)
MCHC: 35.5 g/dL (ref 32.0–36.0)
MCV: 83.9 fL (ref 80.0–100.0)
Platelets: 193 10*3/uL (ref 150–440)
RBC: 4.61 MIL/uL (ref 3.80–5.20)
RDW: 14.1 % (ref 11.5–14.5)
WBC: 10.6 10*3/uL (ref 3.6–11.0)

## 2016-01-19 LAB — URINALYSIS COMPLETE WITH MICROSCOPIC (ARMC ONLY)
Bilirubin Urine: NEGATIVE
Glucose, UA: NEGATIVE mg/dL
HGB URINE DIPSTICK: NEGATIVE
Nitrite: NEGATIVE
PROTEIN: 30 mg/dL — AB
SPECIFIC GRAVITY, URINE: 1.023 (ref 1.005–1.030)
pH: 7 (ref 5.0–8.0)

## 2016-01-19 LAB — COMPREHENSIVE METABOLIC PANEL
ALBUMIN: 3.7 g/dL (ref 3.5–5.0)
ALT: 11 U/L — AB (ref 14–54)
AST: 17 U/L (ref 15–41)
Alkaline Phosphatase: 48 U/L (ref 38–126)
Anion gap: 8 (ref 5–15)
BUN: 8 mg/dL (ref 6–20)
CHLORIDE: 105 mmol/L (ref 101–111)
CO2: 21 mmol/L — AB (ref 22–32)
Calcium: 8.8 mg/dL — ABNORMAL LOW (ref 8.9–10.3)
Creatinine, Ser: 0.45 mg/dL (ref 0.44–1.00)
GFR calc Af Amer: >60 mL/min (ref >60)
Glucose, Bld: 107 mg/dL — ABNORMAL HIGH (ref 65–99)
POTASSIUM: 3.7 mmol/L (ref 3.5–5.1)
SODIUM: 134 mmol/L — AB (ref 135–145)
Total Bilirubin: 0.8 mg/dL (ref 0.3–1.2)
Total Protein: 7.6 g/dL (ref 6.5–8.1)

## 2016-01-19 LAB — LIPASE, BLOOD: LIPASE: 24 U/L (ref 11–51)

## 2016-01-19 LAB — HCG, QUANTITATIVE, PREGNANCY: HCG, BETA CHAIN, QUANT, S: 50238 m[IU]/mL — AB (ref <5)

## 2016-01-19 MED ORDER — DIPHENHYDRAMINE HCL 50 MG/ML IJ SOLN
25.0000 mg | Freq: Once | INTRAMUSCULAR | Status: AC
  Administered 2016-01-19: 25 mg via INTRAVENOUS
  Filled 2016-01-19: qty 1

## 2016-01-19 MED ORDER — DIPHENHYDRAMINE HCL 25 MG PO CAPS: 25.0000 mg | ORAL_CAPSULE | Freq: Four times a day (QID) | ORAL | 0 refills | Status: DC | PRN

## 2016-01-19 MED ORDER — METOCLOPRAMIDE HCL 5 MG/ML IJ SOLN
10.0000 mg | Freq: Once | INTRAMUSCULAR | Status: AC
  Administered 2016-01-19: 10 mg via INTRAVENOUS
  Filled 2016-01-19: qty 2

## 2016-01-19 MED ORDER — METOCLOPRAMIDE HCL 10 MG PO TABS: 10.0000 mg | ORAL_TABLET | Freq: Four times a day (QID) | ORAL | 0 refills | Status: DC | PRN

## 2016-01-19 NOTE — ED Provider Notes (Signed)
[facility] Emergency Department Provider Note  ____________________________________________  Time seen: Approximately 9:15 AM  I have reviewed the triage vital signs and the nursing notes.   HISTORY  Chief Complaint Emesis During Pregnancy    HPI Brooke Fuller is a 30 y.o. female who reports being [redacted] weeks pregnant with her first pregnancy. [facility]. No complications during pregnancy. No previous medical problems. States that she's had morning and pregnancy 5 weeks, ages, but over the last 2 days she had worsening nausea and vomiting and inability to eat or drink much. That she is becoming dehydrated. She is now she has decreased urine output. No dysuria or urgency. No fever, chills, back pain chest pain or shortness of breath. No leg swelling.     Past Medical History:  Diagnosis Date  . PCOS (polycystic ovarian syndrome)   . PCOS (polycystic ovarian syndrome)   . Seizures [facility](HCC)      Patient Active Problem List   Diagnosis Date Noted  . Morbid obesity with body mass index (BMI) of 45.0 to 49.9 in adult [facility](HCC) 12/30/2015  . Rubella non-immune status, antepartum 12/30/2015  . Asthma, history 12/16/2015  . History of seizures 12/16/2015     Past Surgical History:  Procedure Laterality Date  . NO PAST SURGERIES       Prior to Admission medications   Medication Sig Start Date End Date Taking? Authorizing Provider  diphenhydrAMINE (BENADRYL) 25 mg capsule Take 1 capsule (25 mg total) by mouth every 6 (six) hours as needed. Obtain dye-free formulation. 01/19/16   Sharman CheekPhillip Nathaniel Yaden, MD  Doxylamine-Pyridoxine (DICLEGIS) 10-10 MG TBEC Take 2 tablets by mouth at bedtime as needed. 12/10/15   Melody N Shambley, CNM  metoCLOPramide (REGLAN) 10 MG tablet Take 1 tablet (10 mg total) by mouth every 6 (six) hours as needed. 01/19/16   Sharman CheekPhillip Rabecca Birge, MD  Prenatal Vit-Fe Fumarate-FA (PRENATAL MULTIVITAMIN) TABS tablet Take 1 tablet  by mouth daily at 12 noon.    Historical Provider, MD     Allergies Red dye   Family History  Problem Relation Age of Onset  . Alzheimer's disease Mother   . Seizures Mother   . Cancer Maternal Grandmother   . Heart failure Maternal Grandfather   . Hypertension Maternal Grandfather   . Stroke Maternal Grandfather     Social History Social History  Substance Use Topics  . Smoking status: Never Smoker  . Smokeless tobacco: Never Used  . Alcohol use 1.2 oz/week    2 Glasses of wine per week    Review of Systems  Constitutional:   No fever or chills.  ENT:   No sore throat. No rhinorrhea. Cardiovascular:   No chest pain. Respiratory:   No dyspnea or cough. Gastrointestinal:   Negative for abdominal pain, Positive nausea and vomiting.  Genitourinary:   Negative for dysuria or difficulty urinating. Musculoskeletal:   Negative for focal pain or swelling Neurological:   Negative for headaches 10-point ROS otherwise negative.  ____________________________________________   PHYSICAL EXAM:  VITAL SIGNS: ED Triage Vitals  Enc Vitals Group     BP 01/19/16 0749 128/80     Pulse Rate 01/19/16 0749 (!) 110     Resp 01/19/16 0749 18     Temp 01/19/16 0749 98.7 F (37.1 C)     Temp Source 01/19/16 0749 Oral     SpO2 01/19/16 0749 94 %     Weight 01/19/16 0750 259 lb (117.5 kg)  Height 01/19/16 0750 5\' 4"  (1.626 m)     Head Circumference --      Peak Flow --      Pain Score --      Pain Loc --      Pain Edu? --      Excl. in GC? --     Vital signs reviewed, nursing assessments reviewed.   Constitutional:   Alert and oriented. Well appearing and in no distress. Eyes:   No scleral icterus. No conjunctival pallor. PERRL. EOMI.  No nystagmus. ENT   Head:   Normocephalic and atraumatic.   Nose:   No congestion/rhinnorhea. No septal hematoma   Mouth/Throat:   MMM, no pharyngeal erythema. No peritonsillar mass.    Neck:   No stridor. No SubQ emphysema. No  meningismus. Hematological/Lymphatic/Immunilogical:   No cervical lymphadenopathy. Cardiovascular:   Tachycardia heart rate 105. Symmetric bilateral radial and DP pulses.  No murmurs.  Respiratory:   Normal respiratory effort without tachypnea nor retractions. Breath sounds are clear and equal bilaterally. No wheezes/rales/rhonchi. Gastrointestinal:   Soft and nontender. Non distended. There is no CVA tenderness.  No rebound, rigidity, or guarding. Genitourinary:   deferred Musculoskeletal:   Nontender with normal range of motion in all extremities. No joint effusions.  No lower extremity tenderness.  No edema. Neurologic:   Normal speech and language.  CN 2-10 normal. Motor grossly intact. No gross focal neurologic deficits are appreciated.  Skin:    Skin is warm, dry and intact. No rash noted.  No petechiae, purpura, or bullae.  ____________________________________________    LABS (pertinent positives/negatives) (all labs ordered are listed, but only abnormal results are displayed) Labs Reviewed  COMPREHENSIVE METABOLIC PANEL - Abnormal; Notable for the following:       Result Value   Sodium 134 (*)    CO2 21 (*)    Glucose, Bld 107 (*)    Calcium 8.8 (*)    ALT 11 (*)    All other components within normal limits  HCG, QUANTITATIVE, PREGNANCY - Abnormal; Notable for the following:    hCG, Beta Chain, Quant, S 50,238 (*)    All other components within normal limits  LIPASE, BLOOD  CBC  URINALYSIS COMPLETEWITH MICROSCOPIC ([facility] ONLY)  POC URINE PREG, ED   ____________________________________________   EKG    ____________________________________________    RADIOLOGY    ____________________________________________   PROCEDURES Procedures  ____________________________________________   INITIAL IMPRESSION / ASSESSMENT AND PLAN / ED COURSE  Pertinent labs & imaging results that were available during my care of the patient were reviewed by me and considered in  my medical decision making (see chart for details).  Patient well appearing no acute distress. Presents with mild tachycardia, likely related to dehydration versus physiologic changes of pregnancy. Does complain of symptoms of nausea vomiting of pregnancy. Labs are unremarkable, exam is benign. We'll check urinalysis, give IV fluids and Reglan until the patient can tolerate oral intake. No evidence of acidosis or severe dehydration.   Clinical Course    ----------------------------------------- 10:32 AM on 01/19/2016 -----------------------------------------  Heart rate 80. Feels much better. Tolerating oral intake. Discharge home. ____________________________________________   FINAL CLINICAL IMPRESSION(S) / ED DIAGNOSES  Final diagnoses:  Nausea and vomiting of pregnancy, antepartum       Portions of this note were generated with dragon dictation software. Dictation errors may occur despite best attempts at proofreading.    Sharman Cheek, MD 01/19/16 1032

## 2016-01-19 NOTE — ED Notes (Signed)
Pt sitting up drinking clear fluid and crackers, tolerating well.

## 2016-01-19 NOTE — ED Notes (Signed)
Reports [redacted]wks pregnant. States she has vomited several times a day since she was 5 wks but for the past 2 days has vomited all day long.  Denies fever. Skin w/d with good color.

## 2016-01-19 NOTE — ED Notes (Signed)
Tolerated crackers well.  Up to toilet

## 2016-01-19 NOTE — ED Triage Notes (Signed)
Patient states, "I'm thirteen weeks pregnant and I haven't been able to keep anything down for 2 days."  Patient reports taking diclegis with no improvement.  Patient is tearful during triage.

## 2016-01-20 ENCOUNTER — Telehealth: Payer: Self-pay | Admitting: Obstetrics and Gynecology

## 2016-01-20 NOTE — Telephone Encounter (Signed)
FYI

## 2016-01-20 NOTE — Telephone Encounter (Signed)
Patient just wanted to make Brooke Fuller aware that she was seen in the ER for vomiting x 48 hours over the weekend. They changed her diclegis to metoclopramide.She is doing better.

## 2016-02-03 ENCOUNTER — Other Ambulatory Visit: Payer: BLUE CROSS/BLUE SHIELD

## 2016-02-03 ENCOUNTER — Ambulatory Visit (INDEPENDENT_AMBULATORY_CARE_PROVIDER_SITE_OTHER): Payer: BLUE CROSS/BLUE SHIELD | Admitting: Obstetrics and Gynecology

## 2016-02-03 VITALS — BP 134/84 | HR 100 | Wt 258.9 lb

## 2016-02-03 DIAGNOSIS — Z23 Encounter for immunization: Secondary | ICD-10-CM

## 2016-02-03 DIAGNOSIS — Z3492 Encounter for supervision of normal pregnancy, unspecified, second trimester: Secondary | ICD-10-CM

## 2016-02-03 LAB — POCT URINALYSIS DIPSTICK
BILIRUBIN UA: NEGATIVE
GLUCOSE UA: NEGATIVE
KETONES UA: 5
Leukocytes, UA: NEGATIVE
NITRITE UA: NEGATIVE
RBC UA: NEGATIVE
Spec Grav, UA: 1.01
Urobilinogen, UA: 0.2
pH, UA: 6.5

## 2016-02-03 MED ORDER — ONDANSETRON 4 MG PO TBDP: 4.0000 mg | ORAL_TABLET | Freq: Four times a day (QID) | ORAL | 0 refills | Status: DC | PRN

## 2016-02-03 NOTE — Progress Notes (Signed)
ROB- pt is c/o severe nausea, couldn't do early glucola due to vomitting

## 2016-02-03 NOTE — Progress Notes (Signed)
ROB-to add zofran as needed, will do glucola at next visit.

## 2016-02-11 ENCOUNTER — Other Ambulatory Visit: Payer: BLUE CROSS/BLUE SHIELD

## 2016-02-11 DIAGNOSIS — Z131 Encounter for screening for diabetes mellitus: Secondary | ICD-10-CM

## 2016-02-13 LAB — GLUCOSE, 1 HOUR GESTATIONAL: Gestational Diabetes Screen: 139 mg/dL (ref 65–139)

## 2016-02-20 ENCOUNTER — Other Ambulatory Visit: Payer: BLUE CROSS/BLUE SHIELD

## 2016-02-20 DIAGNOSIS — O99810 Abnormal glucose complicating pregnancy: Secondary | ICD-10-CM

## 2016-02-21 LAB — GESTATIONAL GLUCOSE TOLERANCE
GLUCOSE 1 HOUR GTT: 183 mg/dL — AB (ref 65–179)
GLUCOSE FASTING: 89 mg/dL (ref 65–94)
Glucose, GTT - 2 Hour: 175 mg/dL — ABNORMAL HIGH (ref 65–154)
Glucose, GTT - 3 Hour: 104 mg/dL (ref 65–139)

## 2016-02-23 ENCOUNTER — Telehealth: Payer: Self-pay | Admitting: Obstetrics and Gynecology

## 2016-02-23 NOTE — Telephone Encounter (Signed)
Notified pt she failed her glucose test, [facility] should contact [facility] for appt

## 2016-02-23 NOTE — Telephone Encounter (Signed)
PT CALLED AND WANTED TO KNOW HER RESULTS SHE WAS TOLD THAT THEY MIGHT BE BACK TODAY.

## 2016-02-24 ENCOUNTER — Encounter: Payer: Self-pay | Admitting: Obstetrics and Gynecology

## 2016-02-24 ENCOUNTER — Other Ambulatory Visit: Payer: Self-pay | Admitting: Obstetrics and Gynecology

## 2016-02-24 DIAGNOSIS — O24419 Gestational diabetes mellitus in pregnancy, unspecified control: Secondary | ICD-10-CM

## 2016-02-24 NOTE — Telephone Encounter (Signed)
-----   Message from Purcell NailsMelody N Shambley, [place] sent at 02/24/2016 11:53 AM EST ----- Please mail info on gestational diabetes, and set up [facility] appt.

## 2016-02-25 ENCOUNTER — Ambulatory Visit (INDEPENDENT_AMBULATORY_CARE_PROVIDER_SITE_OTHER): Payer: BLUE CROSS/BLUE SHIELD | Admitting: Obstetrics and Gynecology

## 2016-02-25 VITALS — BP 122/81 | HR 104 | Wt 262.7 lb

## 2016-02-25 DIAGNOSIS — Z3492 Encounter for supervision of normal pregnancy, unspecified, second trimester: Secondary | ICD-10-CM

## 2016-02-25 NOTE — Progress Notes (Signed)
ROB- pt is feeling better, was under impression she was getting anatomy scan today

## 2016-02-25 NOTE — Patient Instructions (Signed)
Gestational Diabetes Mellitus, Diagnosis Gestational diabetes (gestational diabetes mellitus) is a temporary form of diabetes that some women develop during pregnancy. It usually occurs around weeks 24-28 of pregnancy and goes away after delivery. Hormonal changes during pregnancy can interfere with insulin production and function, which may result in one or both of these problems:  The pancreas does not make enough of a hormone called insulin.  Cells in the body do not respond properly to insulin that the body makes (insulin resistance). Normally, insulin allows sugars (glucose) to enter cells in the body. The cells use glucose for energy. Insulin resistance or lack of insulin causes excess glucose to build up in the blood instead of going into cells. As a result, high blood glucose (hyperglycemia) develops. What are the risks? If gestational diabetes is treated, it is unlikely to cause problems. If it is not controlled with treatment, it may cause problems during labor and delivery, and some of those problems can be harmful to the unborn baby (fetus) and the mother. Uncontrolled gestational diabetes may also cause the newborn baby to have breathing problems and low blood glucose. Women who get gestational diabetes are more likely to develop it if they get pregnant again, and they are more likely to develop type 2 diabetes in the future. What increases the risk? This condition may be more likely to develop in pregnant women who:  Are older than age 25 during pregnancy.  Have a family history of diabetes.  Are overweight.  Had gestational diabetes in the past.  Have polycystic ovarian syndrome (PCOS).  Are pregnant with twins or multiples.  Are of American-Indian, African-American, Hispanic/Latino, or Asian/Pacific Islander descent. What are the signs or symptoms? Most women do not notice symptoms of gestational diabetes because the symptoms are similar to normal symptoms of pregnancy.  Symptoms of gestational diabetes may include:  Increased thirst (polydipsia).  Increased hunger(polyphagia).  Increased urination (polyuria). How is this diagnosed? This condition may be diagnosed based on your blood glucose level, which may be checked with one or more of the following blood tests:  A fasting blood glucose (FBG) test. You will not be allowed to eat (you will fast) for at least 8 hours before a blood sample is taken.  A random blood glucose test. This checks your blood glucose at any time of day regardless of when you ate.  An oral glucose tolerance test (OGTT). This is usually done during weeks 24-28 of pregnancy.  For this test, you will have an FBG test done. Then, you will drink a beverage that contains glucose. Your blood glucose will be tested again 1 hour after drinking the glucose beverage (1-hour OGTT).  If the 1-hour OGTT result is at or above 140 mg/dL (7.8 mmol/L), you will repeat the OGTT. This time, your blood glucose will be tested 3 hours after drinking the glucose beverage (3-hour OGTT). If you have risk factors, you may be screened for undiagnosed type 2 diabetes at your first health care visit during your pregnancy (prenatal visit). How is this treated? Your treatment may be managed by a specialist called an endocrinologist. This condition is treated by following instructions from your health care provider about:  Eating a healthier diet and getting more physical activity. These changes are the most important ways to manage gestational diabetes.  Checking your blood glucose. Do this as often as told.  Taking diabetes medicines or insulin every day. These will only be prescribed if they are needed.  If you use insulin,   you may need to adjust your dosage based on how physically active you are and what foods you eat. Your health care provider will tell you how to do this. Your health care provider will set treatment goals for you based on the stage of  your pregnancy and any other medical conditions you have. Generally, the goal of treatment is to maintain the following blood glucose levels during pregnancy:  Fasting: at or below 95 mg/dL (5.3 mmol/L).  After meals (postprandial):  One hour after a meal: at or below 140 mg/dL (7.8 mmol/L).  Two hours after a meal: at or below 120 mg/dL (6.7 mmol/L).  A1c (hemoglobin A1c) level: 6-6.5%. Follow these instructions at home: Questions to Ask Your Health Care Provider  Consider asking the following questions:  Do I need to meet with a diabetes educator?  Where can I find a support group for people with diabetes?  What equipment will I need to manage my diabetes at home?  What diabetes medicines do I need, and when should I take them?  How often do I need to check my blood glucose?  What number can I call if I have questions?  When is my next appointment? General Instructions  Take over-the-counter and prescription medicines only as told by your health care provider.  Manage your weight gain during pregnancy. The amount of weight that you are expected to gain depends on your pre-pregnancy BMI (body mass index).  Keep all follow-up visits as told by your health care provider. This is important.  For more information about diabetes, visit:  American Diabetes Association (ADA): www.diabetes.org  American Association of Diabetes Educators (AADE): www.diabeteseducator.org/patient-resources Contact a health care provider if:  Your blood glucose level is at or above 240 mg/dL (13.3 mmol/L).  Your blood glucose level is at or above 200 mg/dL (11.1 mmol/L) and you have ketones in your urine.  You have been sick or have had a fever for 2 days or more and you are not getting better.  You have any of the following problems for more than 6 hours:  You cannot eat or drink.  You have nausea and vomiting.  You have diarrhea. Get help right away if:  Your blood glucose is below 54  mg/dL (3 mmol/L).  You become confused or you have trouble thinking clearly.  You have difficulty breathing.  You have moderate or large ketone levels in your urine.  Your baby is moving around less than usual.  You develop unusual discharge or bleeding from your vagina.  You start having contractions early (prematurely). Contractions may feel like a tightening in your lower abdomen. This information is not intended to replace advice given to you by your health care provider. Make sure you discuss any questions you have with your health care provider. Document Released: 06/28/2000 Document Revised: 08/28/2015 Document Reviewed: 04/25/2015 Elsevier Interactive Patient Education  2017 [employer]  

## 2016-02-25 NOTE — Progress Notes (Signed)
ROB- doing well, nausea almost completely gone. Anatomy scan next week, counseled on GDM and management. [facility] with [facility] on [facility].

## 2016-03-01 ENCOUNTER — Other Ambulatory Visit: Payer: Self-pay | Admitting: Obstetrics and Gynecology

## 2016-03-01 ENCOUNTER — Encounter: Payer: Self-pay | Admitting: *Deleted

## 2016-03-01 ENCOUNTER — Encounter: Payer: BLUE CROSS/BLUE SHIELD | Attending: Obstetrics and Gynecology | Admitting: *Deleted

## 2016-03-01 VITALS — BP 108/60 | Ht 64.0 in | Wt 262.0 lb

## 2016-03-01 DIAGNOSIS — Z713 Dietary counseling and surveillance: Secondary | ICD-10-CM | POA: Insufficient documentation

## 2016-03-01 DIAGNOSIS — Z6841 Body Mass Index (BMI) 40.0 and over, adult: Secondary | ICD-10-CM | POA: Diagnosis not present

## 2016-03-01 DIAGNOSIS — O24419 Gestational diabetes mellitus in pregnancy, unspecified control: Secondary | ICD-10-CM | POA: Insufficient documentation

## 2016-03-01 DIAGNOSIS — Z3A00 Weeks of gestation of pregnancy not specified: Secondary | ICD-10-CM | POA: Diagnosis not present

## 2016-03-01 DIAGNOSIS — O24410 Gestational diabetes mellitus in pregnancy, diet controlled: Secondary | ICD-10-CM

## 2016-03-01 NOTE — Progress Notes (Signed)
Diabetes Self-Management Education  Visit Type: First/Initial  Appt. Start Time: 1340 Appt. End Time: 1510  03/01/2016  Ms. Brooke DurieAlison Hoskie, identified by name and date of birth, is a 30 y.o. female with a diagnosis of Diabetes: Gestational Diabetes.   ASSESSMENT  Blood pressure 108/60, height 5\' 4"  (1.626 m), weight 262 lb (118.8 kg), last menstrual period 10/19/2015. Body mass index is 44.97 kg/m.      Diabetes Self-Management Education - 03/01/16 1626      Visit Information   Visit Type First/Initial     Initial Visit   Diabetes Type Gestational Diabetes   Are you currently following a meal plan? Yes   What type of meal plan do you follow? low carb (15 grams for snack, 30 grams at meals) low sugar   Are you taking your medications as prescribed? Yes   Date Diagnosed last week     Health Coping   How would you rate your overall health? Good     Psychosocial Assessment   Patient Belief/Attitude about Diabetes Motivated to manage diabetes   Self-care barriers None   Self-management support Doctor's office;Family;Friends   Patient Concerns Nutrition/Meal planning;Glycemic Control;Monitoring   Special Needs None   Preferred Learning Style Hands on;Auditory   Learning Readiness Change in progress   How often do you need to have someone help you when you read instructions, pamphlets, or other written materials from your doctor or pharmacy? 1 - Never   What is the last grade level you completed in school? MA     Pre-Education Assessment   Patient understands the diabetes disease and treatment process. Needs Instruction   Patient understands incorporating nutritional management into lifestyle. Needs Review   Patient undertands incorporating physical activity into lifestyle. Needs Instruction   Patient understands using medications safely. Needs Instruction   Patient understands monitoring blood glucose, interpreting and using results Needs Review   Patient understands  prevention, detection, and treatment of acute complications. Needs Instruction   Patient understands prevention, detection, and treatment of chronic complications. Needs Instruction   Patient understands how to develop strategies to address psychosocial issues. Needs Instruction   Patient understands how to develop strategies to promote health/change behavior. Needs Instruction     Complications   How often do you check your blood sugar? 3-4 times/day  Pt has a meter and just started tesing her blood sugars3 days ago.   Fasting Blood glucose range (mg/dL) 16-10970-129  FBG's 77, 83 and 103 after eating pizza last night   Postprandial Blood glucose range (mg/dL) 60-45470-129  pp's mostly 09'W80's - 90's mg/dL with 1 reading of 119121 mg/dL after eating pizza   Have you had a dilated eye exam in the past 12 months? Yes   Have you had a dental exam in the past 12 months? Yes   Are you checking your feet? No     Dietary Intake   Breakfast sausage and toast   Snack (morning) FiberOne bar   Lunch cheese and [place] sandwich or wrap, fruit, yogurt   Snack (afternoon) cheese and sugar free pudding   Dinner meat and vegetables   Snack (evening) crackers and peanut butter   Beverage(s) water, diet soda occasionally     Exercise   Exercise Type ADL's  running after her toddler     Patient Education   Previous Diabetes Education No  talking with friends   Disease state  Definition of diabetes, type 1 and 2, and the diagnosis of diabetes   Nutrition  management  Role of diet in the treatment of diabetes and the relationship between the three main macronutrients and blood glucose level   Physical activity and exercise  Role of exercise on diabetes management, blood pressure control and cardiac health.   Monitoring Purpose and frequency of SMBG.;Taught/discussed recording of test results and interpretation of SMBG.;Ketone testing, when, how.   Chronic complications Relationship between chronic complications and  blood glucose control   Preconception care Pregnancy and GDM  Role of pre-pregnancy blood glucose control on the development of the fetus;Reviewed with patient blood glucose goals with pregnancy;Role of family planning for patients with diabetes     Individualized Goals (developed by patient)   Reducing Risk Improve blood sugars Prevent diabetes complications     Outcomes   Expected Outcomes Demonstrated interest in learning. Expect positive outcomes      Individualized Plan for Diabetes Self-Management Training:   Learning Objective:  Patient will have a greater understanding of diabetes self-management. Patient education plan is to attend individual and/or group sessions per assessed needs and concerns.   Plan:   Patient Instructions  Read booklet on Gestational Diabetes Follow Gestational Meal Planning Guidelines Complete a 3 Day Food Record and bring to next appointment Check blood sugars 4 x day - before breakfast and 2 hrs after every meal and record  Bring blood sugar log to all appointments Call MD for prescription for meter strips and lancets Strips Bayer Contour Lancets   Bayer Microlet Purchase urine ketone strips if blood sugars not controlled and check urine ketones every am:  If + increase bedtime snack to 1 protein and 2 carbohydrate servings Walk 20-30 minutes at least 5 x week if permitted by MD   Expected Outcomes:  Demonstrated interest in learning. Expect positive outcomes  Education material provided:  Gestational Booklet Gestational Meal Planning Guidelines Viewed Gestational Diabetes Video 3 Day Food Record Goals for a Healthy Pregnancy  If problems or questions, patient to contact team via:  Sharion SettlerSheila Carley Glendenning, RN, CCM, CDE [phone]  Future DSME appointment:  March 22, 2016 for appointment with dietitian - pt will be out of town the first few weeks of December

## 2016-03-01 NOTE — Patient Instructions (Signed)
Read booklet on Gestational Diabetes Follow Gestational Meal Planning Guidelines Complete a 3 Day Food Record and bring to next appointment Check blood sugars 4 x day - before breakfast and 2 hrs after every meal and record  Bring blood sugar log to all appointments Call MD for prescription for meter strips and lancets Strips Bayer Contour Lancets   Bayer Microlet Purchase urine ketone strips if blood sugars not controlled and check urine ketones every am:  If + increase bedtime snack to 1 protein and 2 carbohydrate servings Walk 20-30 minutes at least 5 x week if permitted by MD

## 2016-03-04 ENCOUNTER — Ambulatory Visit (INDEPENDENT_AMBULATORY_CARE_PROVIDER_SITE_OTHER): Payer: BLUE CROSS/BLUE SHIELD

## 2016-03-04 DIAGNOSIS — Z3492 Encounter for supervision of normal pregnancy, unspecified, second trimester: Secondary | ICD-10-CM

## 2016-03-16 ENCOUNTER — Encounter: Payer: Self-pay | Admitting: Obstetrics and Gynecology

## 2016-03-22 ENCOUNTER — Encounter: Payer: Self-pay | Admitting: Dietician

## 2016-03-22 ENCOUNTER — Encounter: Payer: BLUE CROSS/BLUE SHIELD | Attending: Obstetrics and Gynecology | Admitting: Dietician

## 2016-03-22 VITALS — BP 110/60 | Ht 64.0 in | Wt 262.2 lb

## 2016-03-22 DIAGNOSIS — Z3A00 Weeks of gestation of pregnancy not specified: Secondary | ICD-10-CM | POA: Insufficient documentation

## 2016-03-22 DIAGNOSIS — Z713 Dietary counseling and surveillance: Secondary | ICD-10-CM | POA: Diagnosis present

## 2016-03-22 DIAGNOSIS — O24419 Gestational diabetes mellitus in pregnancy, unspecified control: Secondary | ICD-10-CM | POA: Diagnosis not present

## 2016-03-22 DIAGNOSIS — O24410 Gestational diabetes mellitus in pregnancy, diet controlled: Secondary | ICD-10-CM

## 2016-03-22 DIAGNOSIS — Z6841 Body Mass Index (BMI) 40.0 and over, adult: Secondary | ICD-10-CM | POA: Insufficient documentation

## 2016-03-22 NOTE — Progress Notes (Signed)
   Patient's BG record indicates BGs are within goal ranges, despite recent 2-week vacation as well as respiratory illness.   Patient's food diary indicates generally balanced meals and good eating pattern.    Provided 1800kcal meal plan, and wrote individualized menus based on patient's food preferences. Discussed goals for pregnancy weight gain at 8-15lbs for patient's BMI.   Instructed patient on food safety, including avoidance of Listeriosis, and limiting mercury from fish.  Discussed importance of maintaining healthy lifestyle habits to reduce risk of Type 2 DM as well as Gestational DM with any future pregnancies.  Advised patient to use any remaining testing supplies to test some BGs after delivery, and to have BG tested ideally annually, as well as prior to attempting future pregnancies.

## 2016-03-22 NOTE — Patient Instructions (Signed)
Continue with current eating pattern, allowing for up to 3 servings of carbohydrate foods with lunch and supper meals.

## 2016-03-24 ENCOUNTER — Other Ambulatory Visit: Payer: Self-pay | Admitting: Obstetrics and Gynecology

## 2016-03-24 DIAGNOSIS — Z0489 Encounter for examination and observation for other specified reasons: Secondary | ICD-10-CM

## 2016-03-24 DIAGNOSIS — IMO0002 Reserved for concepts with insufficient information to code with codable children: Secondary | ICD-10-CM

## 2016-04-02 ENCOUNTER — Ambulatory Visit (INDEPENDENT_AMBULATORY_CARE_PROVIDER_SITE_OTHER): Payer: BLUE CROSS/BLUE SHIELD

## 2016-04-02 ENCOUNTER — Ambulatory Visit (INDEPENDENT_AMBULATORY_CARE_PROVIDER_SITE_OTHER): Payer: BLUE CROSS/BLUE SHIELD | Admitting: Obstetrics and Gynecology

## 2016-04-02 VITALS — BP 118/74 | HR 100 | Wt 258.9 lb

## 2016-04-02 DIAGNOSIS — Z0489 Encounter for examination and observation for other specified reasons: Secondary | ICD-10-CM

## 2016-04-02 DIAGNOSIS — Z362 Encounter for other antenatal screening follow-up: Secondary | ICD-10-CM | POA: Diagnosis not present

## 2016-04-02 DIAGNOSIS — IMO0002 Reserved for concepts with insufficient information to code with codable children: Secondary | ICD-10-CM

## 2016-04-02 DIAGNOSIS — Z3492 Encounter for supervision of normal pregnancy, unspecified, second trimester: Secondary | ICD-10-CM

## 2016-04-02 LAB — POCT URINALYSIS DIPSTICK
BILIRUBIN UA: NEGATIVE
GLUCOSE UA: NEGATIVE
Ketones, UA: NEGATIVE
LEUKOCYTES UA: NEGATIVE
NITRITE UA: NEGATIVE
RBC UA: NEGATIVE
Spec Grav, UA: 1.015
Urobilinogen, UA: 0.2
pH, UA: 6.5

## 2016-04-02 NOTE — Progress Notes (Signed)
ROB- doing well, FBS all <95 and PP all <126, OK to check sugars bid, will start monthly growth scans next visit.

## 2016-04-02 NOTE — Progress Notes (Signed)
ROB- pt is doing well, had follow-up anatomy scan done today,

## 2016-04-05 NOTE — L&D Delivery Note (Signed)
Delivery Note  1915 In room to see pt, reporting pressure with the urge to push. SVE 10/100/0, vertex. Effective maternal pushing efforts with descent of fetal head, SVE 10/100/+2, vertex.   2115 Dr Logan Bores contacted to assess pt as candidate for vacuum assisted birth secondary to maternal exhaustion. Maternal pushing efforts continued.   Verbal consent obtained from patient and spouse for vacuum assisted birth. Ped team at bedside in anticipation of birth.   Bell kiwi vacuum placed on fetal head, assistance over two (2) contractions. Vacuum applied less that five (5) minutes.   At  2215 a viable female was delivered via left occiput anterior position and vertex presentation. Single cord around right foot. Infant immediately to maternal abdomen, receiving nurse at bedside. Delayed cord clamping. Three vessel cord. Cord blood collected.  APGARS 7, 8. Weight pending.   Midline episiotomy repaired using 3-0 vicryl rapide under local and epidural anesthesia. Well approximated.   Placenta manually removed by Dr Logan Bores at 2256. Uterus firm. EBL 300 ml.   In and out cath: 200 ml. Immediate postpartum blood sugar: 122.  Routine postpartum care and orders.   Mom to postpartum.  Baby to Couplet care / Skin to Skin. FOB at beside for birth.    [name], CNM 08/02/2016, 11:22 PM

## 2016-04-30 ENCOUNTER — Ambulatory Visit (INDEPENDENT_AMBULATORY_CARE_PROVIDER_SITE_OTHER): Payer: BLUE CROSS/BLUE SHIELD | Admitting: Obstetrics and Gynecology

## 2016-04-30 ENCOUNTER — Ambulatory Visit (INDEPENDENT_AMBULATORY_CARE_PROVIDER_SITE_OTHER): Payer: BLUE CROSS/BLUE SHIELD

## 2016-04-30 VITALS — BP 123/70 | HR 102 | Wt 262.1 lb

## 2016-04-30 DIAGNOSIS — Z3492 Encounter for supervision of normal pregnancy, unspecified, second trimester: Secondary | ICD-10-CM

## 2016-04-30 DIAGNOSIS — Z131 Encounter for screening for diabetes mellitus: Secondary | ICD-10-CM

## 2016-04-30 DIAGNOSIS — Z23 Encounter for immunization: Secondary | ICD-10-CM

## 2016-04-30 LAB — POCT URINALYSIS DIPSTICK
BILIRUBIN UA: NEGATIVE
GLUCOSE UA: NEGATIVE
KETONES UA: 15
Leukocytes, UA: NEGATIVE
Nitrite, UA: NEGATIVE
Protein, UA: NEGATIVE
RBC UA: NEGATIVE
SPEC GRAV UA: 1.015
UROBILINOGEN UA: 0.2
pH, UA: 6

## 2016-04-30 MED ORDER — TETANUS-DIPHTH-ACELL PERTUSSIS 5-2.5-18.5 LF-MCG/0.5 IM SUSP
0.5000 mL | Freq: Once | INTRAMUSCULAR | Status: AC
  Administered 2016-04-30: 0.5 mL via INTRAMUSCULAR

## 2016-04-30 NOTE — Progress Notes (Signed)
ROB- blood consent signed, tdap given Pt is doing well

## 2016-04-30 NOTE — Progress Notes (Signed)
ROB-doing well, all BS normal except 2 PP, Ok to decrease to checking fasting and one PP daily  Indications: EFW and AFI for GDM Findings:  Singleton intrauterine pregnancy is visualized with FHR at 153 BPM. Biometrics give an (U/S) Gestational age of [redacted] weeks and 6 days, and an (U/S) EDD of 07/24/16; this correlates with the clinically established EDD of 07/25/16.  Fetal presentation is vertex, spine anterior.  EFW: 1132 grams ( 2 lbs. 8 oz. ) 49th percentile, Williams Placenta: Posterior, grade 1 AFI: Adequate at 16.5 cm.  Anatomic survey of the fetal lateral ventricle, stomach, bladder and kidneys appears WNL. Gender - Female.    Impression: 1. 27 week 6 day Viable Singleton Intrauterine pregnancy by U/S. 2. (U/S) EDD is consistent with Clinically established (LMP) EDD of 07/25/16. 3. EFW: 1132 grams ( 2 lbs. 8 oz. ) 49th percentile, Williams. 4. AFI is adequate at 16.5 cm.

## 2016-04-30 NOTE — Patient Instructions (Signed)
Third Trimester of Pregnancy The third trimester is from week 29 through week 40 (months 7 through 9). The third trimester is a time when the unborn baby (fetus) is growing rapidly. At the end of the ninth month, the fetus is about 20 inches in length and weighs 6-10 pounds. Body changes during your third trimester Your body goes through many changes during pregnancy. The changes vary from woman to woman. During the third trimester:  Your weight will continue to increase. You can expect to gain 25-35 pounds (11-16 kg) by the end of the pregnancy.  You may begin to get stretch marks on your hips, abdomen, and breasts.  You may urinate more often because the fetus is moving lower into your pelvis and pressing on your bladder.  You may develop or continue to have heartburn. This is caused by increased hormones that slow down muscles in the digestive tract.  You may develop or continue to have constipation because increased hormones slow digestion and cause the muscles that push waste through your intestines to relax.  You may develop hemorrhoids. These are swollen veins (varicose veins) in the rectum that can itch or be painful.  You may develop swollen, bulging veins (varicose veins) in your legs.  You may have increased body aches in the pelvis, back, or thighs. This is due to weight gain and increased hormones that are relaxing your joints.  You may have changes in your hair. These can include thickening of your hair, rapid growth, and changes in texture. Some women also have hair loss during or after pregnancy, or hair that feels dry or thin. Your hair will most likely return to normal after your baby is born.  Your breasts will continue to grow and they will continue to become tender. A yellow fluid (colostrum) may leak from your breasts. This is the first milk you are producing for your baby.  Your belly button may stick out.  You may notice more swelling in your hands, face, or  ankles.  You may have increased tingling or numbness in your hands, arms, and legs. The skin on your belly may also feel numb.  You may feel short of breath because of your expanding uterus.  You may have more problems sleeping. This can be caused by the size of your belly, increased need to urinate, and an increase in your body's metabolism.  You may notice the fetus "dropping," or moving lower in your abdomen.  You may have increased vaginal discharge.  Your cervix becomes thin and soft (effaced) near your due date. What to expect at prenatal visits You will have prenatal exams every 2 weeks until week 36. Then you will have weekly prenatal exams. During a routine prenatal visit:  You will be weighed to make sure you and the fetus are growing normally.  Your blood pressure will be taken.  Your abdomen will be measured to track your baby's growth.  The fetal heartbeat will be listened to.  Any test results from the previous visit will be discussed.  You may have a cervical check near your due date to see if you have effaced. At around 36 weeks, your health care provider will check your cervix. At the same time, your health care provider will also perform a test on the secretions of the vaginal tissue. This test is to determine if a type of bacteria, Group B streptococcus, is present. Your health care provider will explain this further. Your health care provider may ask you:    What your birth plan is.  How you are feeling.  If you are feeling the baby move.  If you have had any abnormal symptoms, such as leaking fluid, bleeding, severe headaches, or abdominal cramping.  If you are using any tobacco products, including cigarettes, chewing tobacco, and electronic cigarettes.  If you have any questions. Other tests or screenings that may be performed during your third trimester include:  Blood tests that check for low iron levels (anemia).  Fetal testing to check the health,  activity level, and growth of the fetus. Testing is done if you have certain medical conditions or if there are problems during the pregnancy.  Nonstress test (NST). This test checks the health of your baby to make sure there are no signs of problems, such as the baby not getting enough oxygen. During this test, a belt is placed around your belly. The baby is made to move, and its heart rate is monitored during movement. What is false labor? False labor is a condition in which you feel small, irregular tightenings of the muscles in the womb (contractions) that eventually go away. These are called Braxton Hicks contractions. Contractions may last for hours, days, or even weeks before true labor sets in. If contractions come at regular intervals, become more frequent, increase in intensity, or become painful, you should see your health care provider. What are the signs of labor?  Abdominal cramps.  Regular contractions that start at 10 minutes apart and become stronger and more frequent with time.  Contractions that start on the top of the uterus and spread down to the lower abdomen and back.  Increased pelvic pressure and dull back pain.  A watery or bloody mucus discharge that comes from the vagina.  Leaking of amniotic fluid. This is also known as your "water breaking." It could be a slow trickle or a gush. Let your doctor know if it has a color or strange odor. If you have any of these signs, call your health care provider right away, even if it is before your due date. Follow these instructions at home: Eating and drinking  Continue to eat regular, healthy meals.  Do not eat:  Raw meat or meat spreads.  Unpasteurized milk or cheese.  Unpasteurized juice.  Store-made salad.  Refrigerated smoked seafood.  Hot dogs or deli meat, unless they are piping hot.  More than 6 ounces of albacore tuna a week.  Shark, swordfish, king mackerel, or tile fish.  Store-made salads.  Raw  sprouts, such as mung bean or alfalfa sprouts.  Take prenatal vitamins as told by your health care provider.  Take 1000 mg of calcium daily as told by your health care provider.  If you develop constipation:  Take over-the-counter or prescription medicines.  Drink enough fluid to keep your urine clear or pale yellow.  Eat foods that are high in fiber, such as fresh fruits and vegetables, whole grains, and beans.  Limit foods that are high in fat and processed sugars, such as fried and sweet foods. Activity  Exercise only as directed by your health care provider. Healthy pregnant women should aim for 2 hours and 30 minutes of moderate exercise per week. If you experience any pain or discomfort while exercising, stop.  Avoid heavy lifting.  Do not exercise in extreme heat or humidity, or at high altitudes.  Wear low-heel, comfortable shoes.  Practice good posture.  Do not travel far distances unless it is absolutely necessary and only with the approval   of your health care provider.  Wear your seat belt at all times while in a car, on a bus, or on a plane.  Take frequent breaks and rest with your legs elevated if you have leg cramps or low back pain.  Do not use hot tubs, steam rooms, or saunas.  You may continue to have sex unless your health care provider tells you otherwise. Lifestyle  Do not use any products that contain nicotine or tobacco, such as cigarettes and e-cigarettes. If you need help quitting, ask your health care provider.  Do not drink alcohol.  Do not use any medicinal herbs or unprescribed drugs. These chemicals affect the formation and growth of the baby.  If you develop varicose veins:  Wear support pantyhose or compression stockings as told by your healthcare provider.  Elevate your feet for 15 minutes, 3-4 times a day.  Wear a supportive maternity bra to help with breast tenderness. General instructions  Take over-the-counter and prescription  medicines only as told by your health care provider. There are medicines that are either safe or unsafe to take during pregnancy.  Take warm sitz baths to soothe any pain or discomfort caused by hemorrhoids. Use hemorrhoid cream or witch hazel if your health care provider approves.  Avoid cat litter boxes and soil used by cats. These carry germs that can cause birth defects in the baby. If you have a cat, ask someone to clean the litter box for you.  To prepare for the arrival of your baby:  Take prenatal classes to understand, practice, and ask questions about the labor and delivery.  Make a trial run to the hospital.  Visit the hospital and tour the maternity area.  Arrange for maternity or paternity leave through employers.  Arrange for family and friends to take care of pets while you are in the hospital.  Purchase a rear-facing car seat and make sure you know how to install it in your car.  Pack your hospital bag.  Prepare the baby's nursery. Make sure to remove all pillows and stuffed animals from the baby's crib to prevent suffocation.  Visit your dentist if you have not gone during your pregnancy. Use a soft toothbrush to brush your teeth and be gentle when you floss.  Keep all prenatal follow-up visits as told by your health care provider. This is important. Contact a health care provider if:  You are unsure if you are in labor or if your water has broken.  You become dizzy.  You have mild pelvic cramps, pelvic pressure, or nagging pain in your abdominal area.  You have lower back pain.  You have persistent nausea, vomiting, or diarrhea.  You have an unusual or bad smelling vaginal discharge.  You have pain when you urinate. Get help right away if:  You have a fever.  You are leaking fluid from your vagina.  You have spotting or bleeding from your vagina.  You have severe abdominal pain or cramping.  You have rapid weight loss or weight gain.  You have  shortness of breath with chest pain.  You notice sudden or extreme swelling of your face, hands, ankles, feet, or legs.  Your baby makes fewer than 10 movements in 2 hours.  You have severe headaches that do not go away with medicine.  You have vision changes. Summary  The third trimester is from week 29 through week 40, months 7 through 9. The third trimester is a time when the unborn baby (fetus)   is growing rapidly.  During the third trimester, your discomfort may increase as you and your baby continue to gain weight. You may have abdominal, leg, and back pain, sleeping problems, and an increased need to urinate.  During the third trimester your breasts will keep growing and they will continue to become tender. A yellow fluid (colostrum) may leak from your breasts. This is the first milk you are producing for your baby.  False labor is a condition in which you feel small, irregular tightenings of the muscles in the womb (contractions) that eventually go away. These are called Braxton Hicks contractions. Contractions may last for hours, days, or even weeks before true labor sets in.  Signs of labor can include: abdominal cramps; regular contractions that start at 10 minutes apart and become stronger and more frequent with time; watery or bloody mucus discharge that comes from the vagina; increased pelvic pressure and dull back pain; and leaking of amniotic fluid. This information is not intended to replace advice given to you by your health care provider. Make sure you discuss any questions you have with your health care provider. Document Released: 03/16/2001 Document Revised: 08/28/2015 Document Reviewed: 05/23/2012 Elsevier Interactive Patient Education  2017 [employer]  

## 2016-05-01 LAB — HEMOGLOBIN AND HEMATOCRIT, BLOOD
Hematocrit: 36.2 % (ref 34.0–46.6)
Hemoglobin: 11.9 g/dL (ref 11.1–15.9)

## 2016-05-13 ENCOUNTER — Ambulatory Visit (INDEPENDENT_AMBULATORY_CARE_PROVIDER_SITE_OTHER): Payer: BLUE CROSS/BLUE SHIELD | Admitting: Obstetrics and Gynecology

## 2016-05-13 VITALS — BP 115/67 | HR 95 | Wt 262.5 lb

## 2016-05-13 DIAGNOSIS — Z3493 Encounter for supervision of normal pregnancy, unspecified, third trimester: Secondary | ICD-10-CM

## 2016-05-13 LAB — POCT URINALYSIS DIPSTICK
BILIRUBIN UA: NEGATIVE
GLUCOSE UA: NEGATIVE
Ketones, UA: NEGATIVE
LEUKOCYTES UA: NEGATIVE
NITRITE UA: NEGATIVE
PH UA: 7
RBC UA: NEGATIVE
Spec Grav, UA: 1.01
UROBILINOGEN UA: 0.2

## 2016-05-13 NOTE — Progress Notes (Signed)
ROB-FBS and PP all normal, considering vasectomy and breast feeding.

## 2016-05-13 NOTE — Progress Notes (Signed)
ROB- pt is doing well, denies any complaints 

## 2016-05-14 ENCOUNTER — Encounter: Payer: BLUE CROSS/BLUE SHIELD | Admitting: Obstetrics and Gynecology

## 2016-05-24 ENCOUNTER — Other Ambulatory Visit: Payer: Self-pay | Admitting: Obstetrics and Gynecology

## 2016-05-24 DIAGNOSIS — O24429 Gestational diabetes mellitus in childbirth, unspecified control: Secondary | ICD-10-CM

## 2016-05-28 ENCOUNTER — Ambulatory Visit (INDEPENDENT_AMBULATORY_CARE_PROVIDER_SITE_OTHER): Payer: BLUE CROSS/BLUE SHIELD

## 2016-05-28 ENCOUNTER — Ambulatory Visit (INDEPENDENT_AMBULATORY_CARE_PROVIDER_SITE_OTHER): Payer: BLUE CROSS/BLUE SHIELD | Admitting: Obstetrics and Gynecology

## 2016-05-28 VITALS — BP 108/72 | HR 99 | Wt 265.1 lb

## 2016-05-28 DIAGNOSIS — O24429 Gestational diabetes mellitus in childbirth, unspecified control: Secondary | ICD-10-CM

## 2016-05-28 DIAGNOSIS — Z3493 Encounter for supervision of normal pregnancy, unspecified, third trimester: Secondary | ICD-10-CM

## 2016-05-28 LAB — POCT URINALYSIS DIPSTICK
BILIRUBIN UA: NEGATIVE
Glucose, UA: NEGATIVE
KETONES UA: NEGATIVE
Leukocytes, UA: NEGATIVE
Nitrite, UA: NEGATIVE
PH UA: 6.5
PROTEIN UA: NEGATIVE
RBC UA: NEGATIVE
Spec Grav, UA: 1.015
Urobilinogen, UA: 0.2

## 2016-05-28 NOTE — Progress Notes (Signed)
ROB- pt had growth scan done today, BS log copied, pt is doing well

## 2016-05-28 NOTE — Progress Notes (Signed)
ROB -reviewed FBS and PP all good except a few. Indications:Growth and AFI for GDM Findings:  Singleton intrauterine pregnancy is visualized with FHR at 160 BPM. Biometrics give an (U/S) Gestational age of 30 3/7 weeks and an (U/S) EDD of 07/13/16; this correlates with the clinically established EDD of 07/25/16.  Fetal presentation is Vertex.  EFW: 2026g (4lb 7 oz) Williams 57th percentile. Placenta: grade 0, remote to cervix. AFI: 16.8 cm.   Impression: 1. 33 3/7 week Viable Singleton Intrauterine pregnancy by U/S. 2. (U/S) EDD is consistent with Clinically established (LMP) EDD of 07/25/16. 3. Adequate growth and AFI

## 2016-05-31 ENCOUNTER — Other Ambulatory Visit: Payer: Self-pay | Admitting: Obstetrics and Gynecology

## 2016-06-11 ENCOUNTER — Ambulatory Visit (INDEPENDENT_AMBULATORY_CARE_PROVIDER_SITE_OTHER): Payer: BLUE CROSS/BLUE SHIELD | Admitting: Obstetrics and Gynecology

## 2016-06-11 VITALS — BP 128/72 | HR 115 | Wt 265.5 lb

## 2016-06-11 DIAGNOSIS — Z3493 Encounter for supervision of normal pregnancy, unspecified, third trimester: Secondary | ICD-10-CM

## 2016-06-11 LAB — POCT URINALYSIS DIPSTICK
BILIRUBIN UA: NEGATIVE
Blood, UA: NEGATIVE
GLUCOSE UA: NEGATIVE
KETONES UA: 15
Leukocytes, UA: NEGATIVE
Nitrite, UA: NEGATIVE
SPEC GRAV UA: 1.02
Urobilinogen, UA: 0.2
pH, UA: 6

## 2016-06-11 NOTE — Progress Notes (Signed)
ROB- Doing well, FBS all but 1 under 95 & PP all but 2 under 125. Will repeat growth scan next visit,

## 2016-06-11 NOTE — Progress Notes (Signed)
ROB- pt is doing well, has head cold tried benadryl with no help

## 2016-06-18 ENCOUNTER — Other Ambulatory Visit: Payer: Self-pay | Admitting: Obstetrics and Gynecology

## 2016-06-18 DIAGNOSIS — O24410 Gestational diabetes mellitus in pregnancy, diet controlled: Secondary | ICD-10-CM

## 2016-06-24 ENCOUNTER — Encounter: Payer: Self-pay | Admitting: Certified Nurse Midwife

## 2016-06-24 ENCOUNTER — Ambulatory Visit (INDEPENDENT_AMBULATORY_CARE_PROVIDER_SITE_OTHER): Payer: BLUE CROSS/BLUE SHIELD | Admitting: Certified Nurse Midwife

## 2016-06-24 ENCOUNTER — Ambulatory Visit (INDEPENDENT_AMBULATORY_CARE_PROVIDER_SITE_OTHER): Payer: BLUE CROSS/BLUE SHIELD

## 2016-06-24 VITALS — BP 99/69 | HR 90 | Wt 269.0 lb

## 2016-06-24 DIAGNOSIS — Z3493 Encounter for supervision of normal pregnancy, unspecified, third trimester: Secondary | ICD-10-CM

## 2016-06-24 LAB — POCT URINALYSIS DIPSTICK
BILIRUBIN UA: NEGATIVE
Blood, UA: NEGATIVE
Glucose, UA: NEGATIVE
KETONES UA: NEGATIVE
LEUKOCYTES UA: NEGATIVE
NITRITE UA: NEGATIVE
PH UA: 7 (ref 5.0–8.0)
Protein, UA: NEGATIVE
Spec Grav, UA: 1.015 (ref 1.030–1.035)
Urobilinogen, UA: NEGATIVE (ref ?–2.0)

## 2016-06-24 LAB — POCT GLUCOSE (DEVICE FOR HOME USE)

## 2016-06-24 NOTE — Patient Instructions (Signed)

## 2016-06-24 NOTE — Progress Notes (Signed)
ROB-Pt doing well. Reviewed US findings. Discussed weekly blood sugar log. All except one (1) fasting level <95. All except two (2) PP levels < 120. Pt aware of foods associated with elevated levels. Reviewed red flag symptoms and when to call. RTC x 1 weeks for ROB including 36 week cultures.   ULTRASOUND REPORT  Location: [facility] Date of Service: 06/24/16  Indications:Growth and AFI for GDM Findings:  Mason JimSingleton intrauterine pregnancy is visualized with FHR at 150 BPM. Biometrics give an (U/S) Gestational age of 30 3/7  weeks and an (U/S) EDD of 07/12/16; this correlates with the clinically established EDD of 07/25/16.  Fetal presentation is Vertex.  EFW: 3084g (6lb 13 oz) Williams 68th percentile. Placenta: Posterior, Grade 1-2, remote to cervix. AFI: 17.2 cm.   Impression: 1. 37 3/7 week Viable Singleton Intrauterine pregnancy by U/S. 2. (U/S) EDD is consistent with Clinically established (LMP) EDD of 07/25/16. 3. Adequate growth and AFI

## 2016-07-01 ENCOUNTER — Encounter: Payer: Self-pay | Admitting: Certified Nurse Midwife

## 2016-07-01 ENCOUNTER — Ambulatory Visit (INDEPENDENT_AMBULATORY_CARE_PROVIDER_SITE_OTHER): Payer: BLUE CROSS/BLUE SHIELD | Admitting: Certified Nurse Midwife

## 2016-07-01 VITALS — BP 108/65 | HR 97 | Wt 272.7 lb

## 2016-07-01 DIAGNOSIS — Z3402 Encounter for supervision of normal first pregnancy, second trimester: Secondary | ICD-10-CM

## 2016-07-01 LAB — POCT URINALYSIS DIPSTICK
BILIRUBIN UA: NEGATIVE
Blood, UA: NEGATIVE
Glucose, UA: NEGATIVE
Leukocytes, UA: NEGATIVE
Nitrite, UA: NEGATIVE
PH UA: 7 (ref 5.0–8.0)
Protein, UA: NEGATIVE
Spec Grav, UA: 1.01 (ref 1.030–1.035)
Urobilinogen, UA: NEGATIVE (ref ?–2.0)

## 2016-07-01 NOTE — Progress Notes (Signed)
ROB-Pt doing well. Reviewed weekly blood sugar log. All fasting <95. All PP <120. Praise given. 36 week cultures collected today. Pt birthday is this weekend, advised okay to have a cupcake when celebrating. Reviewed red flag symptoms and when to call. RTC x 1 week for ROB.

## 2016-07-01 NOTE — Patient Instructions (Signed)

## 2016-07-04 LAB — GC/CHLAMYDIA PROBE AMP
CHLAMYDIA, DNA PROBE: NEGATIVE
NEISSERIA GONORRHOEAE BY PCR: NEGATIVE

## 2016-07-04 LAB — STREP GP B NAA: Strep Gp B NAA: POSITIVE — AB

## 2016-07-09 ENCOUNTER — Ambulatory Visit (INDEPENDENT_AMBULATORY_CARE_PROVIDER_SITE_OTHER): Payer: BLUE CROSS/BLUE SHIELD | Admitting: Certified Nurse Midwife

## 2016-07-09 ENCOUNTER — Encounter: Payer: Self-pay | Admitting: Certified Nurse Midwife

## 2016-07-09 VITALS — BP 133/76 | HR 96 | Wt 272.2 lb

## 2016-07-09 DIAGNOSIS — Z3493 Encounter for supervision of normal pregnancy, unspecified, third trimester: Secondary | ICD-10-CM

## 2016-07-09 DIAGNOSIS — O24410 Gestational diabetes mellitus in pregnancy, diet controlled: Secondary | ICD-10-CM

## 2016-07-09 NOTE — Patient Instructions (Signed)

## 2016-07-09 NOTE — Progress Notes (Signed)
ROB- doing well. No complaints. She denies LOF, vaginal bleeding and has occasional contractions. Blood sugar log reviewed: 1 elevated fasting (106) and 1 elevated PP (138).  Encouraged kick counts twice daily. Labor precautions reviewed. ROB in one week.   Doreene Burke, CNM

## 2016-07-12 DIAGNOSIS — O24419 Gestational diabetes mellitus in pregnancy, unspecified control: Secondary | ICD-10-CM | POA: Insufficient documentation

## 2016-07-14 ENCOUNTER — Ambulatory Visit (INDEPENDENT_AMBULATORY_CARE_PROVIDER_SITE_OTHER): Payer: BLUE CROSS/BLUE SHIELD | Admitting: Certified Nurse Midwife

## 2016-07-14 ENCOUNTER — Telehealth: Payer: Self-pay

## 2016-07-14 VITALS — BP 131/73 | HR 88 | Wt 273.0 lb

## 2016-07-14 DIAGNOSIS — O24410 Gestational diabetes mellitus in pregnancy, diet controlled: Secondary | ICD-10-CM

## 2016-07-14 DIAGNOSIS — Z3493 Encounter for supervision of normal pregnancy, unspecified, third trimester: Secondary | ICD-10-CM

## 2016-07-14 LAB — POCT URINALYSIS DIPSTICK
Bilirubin, UA: NEGATIVE
Blood, UA: NEGATIVE
GLUCOSE UA: NEGATIVE
Ketones, UA: NEGATIVE
LEUKOCYTES UA: NEGATIVE — AB
Nitrite, UA: NEGATIVE
PROTEIN UA: NEGATIVE
Spec Grav, UA: 1.01 (ref 1.010–1.025)
UROBILINOGEN UA: NEGATIVE U/dL — AB
pH, UA: 8 (ref 5.0–8.0)

## 2016-07-14 NOTE — Patient Instructions (Signed)
Fetal Movement Counts  Patient Name: ________________________________________________ Patient Due Date: ____________________  What is a fetal movement count?  A fetal movement count is the number of times that you feel your baby move during a certain amount of time. This may also be called a fetal kick count. A fetal movement count is recommended for every pregnant woman. You may be asked to start counting fetal movements as early as week 28 of your pregnancy.  Pay attention to when your baby is most active. You may notice your baby's sleep and wake cycles. You may also notice things that make your baby move more. You should do a fetal movement count:  · When your baby is normally most active.  · At the same time each day.    A good time to count movements is while you are resting, after having something to eat and drink.  How do I count fetal movements?  1. Find a quiet, comfortable area. Sit, or lie down on your side.  2. Write down the date, the start time and stop time, and the number of movements that you felt between those two times. Take this information with you to your health care visits.  3. For 2 hours, count kicks, flutters, swishes, rolls, and jabs. You should feel at least 10 movements during 2 hours.  4. You may stop counting after you have felt 10 movements.  5. If you do not feel 10 movements in 2 hours, have something to eat and drink. Then, keep resting and counting for 1 hour. If you feel at least 4 movements during that hour, you may stop counting.  Contact a health care provider if:  · You feel fewer than 4 movements in 2 hours.  · Your baby is not moving like he or she usually does.  Date: ____________ Start time: ____________ Stop time: ____________ Movements: ____________  Date: ____________ Start time: ____________ Stop time: ____________ Movements: ____________  Date: ____________ Start time: ____________ Stop time: ____________ Movements: ____________  Date: ____________ Start time:  ____________ Stop time: ____________ Movements: ____________  Date: ____________ Start time: ____________ Stop time: ____________ Movements: ____________  Date: ____________ Start time: ____________ Stop time: ____________ Movements: ____________  Date: ____________ Start time: ____________ Stop time: ____________ Movements: ____________  Date: ____________ Start time: ____________ Stop time: ____________ Movements: ____________  Date: ____________ Start time: ____________ Stop time: ____________ Movements: ____________  This information is not intended to replace advice given to you by your health care provider. Make sure you discuss any questions you have with your health care provider.  Document Released: 04/21/2006 Document Revised: 11/19/2015 Document Reviewed: 05/01/2015  Elsevier Interactive Patient Education © 2017 [employer]

## 2016-07-14 NOTE — Telephone Encounter (Addendum)
Pt calls c/o decreased fetal movement. Baby usually moves a lot at night and am but movement not as strong. Pt did drink glass of ice water, laid on left side and the baby moved 3x within 30 min. Having bad period type cramps, no vaginal bleeding. Pt to continue to drink fluids and eat, monitor fetal movement. Will check on her later this am unless pt calls sooner with any changes.

## 2016-07-14 NOTE — Progress Notes (Signed)
Pt called today complaining of decreased fetal movement. NST completed. Reactive , category 1 strip Baseline : 135, Accels present, decels absent, Irritability noted. She started feeling movement on the way into office.  She denies lOF and vaginal bleeding. Kick counts reviewed. Pt to follow up as scheduled on Friday.   Doreene Burke, CNM

## 2016-07-14 NOTE — Addendum Note (Signed)
Addended by: Jackquline Denmark on: 07/14/2016 12:58 PM   Modules accepted: Orders

## 2016-07-14 NOTE — Telephone Encounter (Signed)
Pt states the baby movements did pick up upon resting but up and about now and she feels like baby is now sleeping. Pt to come in to office at 10:30 am for NST.

## 2016-07-16 ENCOUNTER — Ambulatory Visit (INDEPENDENT_AMBULATORY_CARE_PROVIDER_SITE_OTHER): Payer: BLUE CROSS/BLUE SHIELD | Admitting: Certified Nurse Midwife

## 2016-07-16 VITALS — BP 101/71 | HR 99 | Wt 273.4 lb

## 2016-07-16 DIAGNOSIS — Z3403 Encounter for supervision of normal first pregnancy, third trimester: Secondary | ICD-10-CM

## 2016-07-16 LAB — POCT URINALYSIS DIPSTICK
BILIRUBIN UA: NEGATIVE
Glucose, UA: NEGATIVE
KETONES UA: 40
Nitrite, UA: NEGATIVE
Spec Grav, UA: >=1.03 — AB (ref 1.010–1.025)
Urobilinogen, UA: 0.2 E.U./dL
pH, UA: 6 (ref 5.0–8.0)

## 2016-07-16 NOTE — Patient Instructions (Signed)

## 2016-07-16 NOTE — Progress Notes (Signed)
ROB, doing well. Sugars well controlled . Copy of log scanned into epic. She endorses good fetal movement. Fundus difficult to measure due to body habitus. SVE per pt request. Discussed labor precautions. Inquired it she will need another ultrasound. Discussed BPP at 40 wks if she has not delivered. She agrees to plan. ROB in 1 wks.   Doreene Burke, CNM

## 2016-07-21 ENCOUNTER — Ambulatory Visit (INDEPENDENT_AMBULATORY_CARE_PROVIDER_SITE_OTHER): Payer: BLUE CROSS/BLUE SHIELD | Admitting: Certified Nurse Midwife

## 2016-07-21 VITALS — BP 126/83 | HR 96 | Wt 274.8 lb

## 2016-07-21 DIAGNOSIS — Z3403 Encounter for supervision of normal first pregnancy, third trimester: Secondary | ICD-10-CM

## 2016-07-21 LAB — POCT URINALYSIS DIPSTICK
Bilirubin, UA: NEGATIVE
Blood, UA: NEGATIVE
Glucose, UA: NEGATIVE
Ketones, UA: NEGATIVE
LEUKOCYTES UA: NEGATIVE
NITRITE UA: NEGATIVE
PH UA: 7 (ref 5.0–8.0)
PROTEIN UA: NEGATIVE
Spec Grav, UA: 1.01 (ref 1.010–1.025)
Urobilinogen, UA: NEGATIVE E.U./dL — AB

## 2016-07-21 NOTE — Progress Notes (Signed)
ROB doing well. Denies LOG and vaginal bleeding. Haivng mild irregular contractions. Reviewed labor precautions. Blood sugars well controled. She will return in one week. NST at that appointment .Will schedule induction at next visit for 41 wks.   Doreene Burke, CNM

## 2016-07-21 NOTE — Patient Instructions (Signed)

## 2016-07-28 ENCOUNTER — Ambulatory Visit (INDEPENDENT_AMBULATORY_CARE_PROVIDER_SITE_OTHER): Payer: BLUE CROSS/BLUE SHIELD | Admitting: Certified Nurse Midwife

## 2016-07-28 ENCOUNTER — Encounter: Payer: Self-pay | Admitting: Certified Nurse Midwife

## 2016-07-28 VITALS — BP 120/64 | HR 84 | Wt 276.0 lb

## 2016-07-28 DIAGNOSIS — Z3403 Encounter for supervision of normal first pregnancy, third trimester: Secondary | ICD-10-CM

## 2016-07-28 DIAGNOSIS — O24410 Gestational diabetes mellitus in pregnancy, diet controlled: Secondary | ICD-10-CM | POA: Diagnosis not present

## 2016-07-28 LAB — POCT URINALYSIS DIPSTICK
BILIRUBIN UA: NEGATIVE
Glucose, UA: NEGATIVE
KETONES UA: NEGATIVE
Leukocytes, UA: NEGATIVE
Nitrite, UA: NEGATIVE
PH UA: 6.5 (ref 5.0–8.0)
PROTEIN UA: NEGATIVE
RBC UA: NEGATIVE
Spec Grav, UA: 1.015 (ref 1.010–1.025)
Urobilinogen, UA: 0.2 E.U./dL

## 2016-07-28 NOTE — Patient Instructions (Signed)

## 2016-07-28 NOTE — Progress Notes (Signed)
ROB today with NST.  No complaints. SVE 1 cm thick and high. Reviewed blood sugar log. 2 elevated PP values.  Discussed induction of labor process. She is in agreement to plan. Scheduled for induction at [facility] 8 pm 08/01/16 (post dates) for cervical ripening.  NST today: reactive, category 1  Baseline:135 Variability: moderate Accelerations: present Decelerations: absent  Contractions: occasional   Labor precautions reviewed.   Doreene Burke, CNM

## 2016-08-01 ENCOUNTER — Other Ambulatory Visit: Payer: Self-pay | Admitting: Certified Nurse Midwife

## 2016-08-01 ENCOUNTER — Inpatient Hospital Stay
Admission: EM | Admit: 2016-08-01 | Discharge: 2016-08-04 | DRG: 767 | Disposition: A | Discharge status: Home / Self Care | Payer: BLUE CROSS/BLUE SHIELD | Attending: Obstetrics and Gynecology | Admitting: Obstetrics and Gynecology

## 2016-08-01 DIAGNOSIS — O99214 Obesity complicating childbirth: Secondary | ICD-10-CM | POA: Diagnosis present

## 2016-08-01 DIAGNOSIS — O24410 Gestational diabetes mellitus in pregnancy, diet controlled: Secondary | ICD-10-CM

## 2016-08-01 DIAGNOSIS — J45909 Unspecified asthma, uncomplicated: Secondary | ICD-10-CM | POA: Diagnosis present

## 2016-08-01 DIAGNOSIS — Z87898 Personal history of other specified conditions: Secondary | ICD-10-CM

## 2016-08-01 DIAGNOSIS — O24420 Gestational diabetes mellitus in childbirth, diet controlled: Secondary | ICD-10-CM | POA: Diagnosis present

## 2016-08-01 DIAGNOSIS — Z3A41 41 weeks gestation of pregnancy: Secondary | ICD-10-CM

## 2016-08-01 DIAGNOSIS — E282 Polycystic ovarian syndrome: Secondary | ICD-10-CM | POA: Diagnosis present

## 2016-08-01 DIAGNOSIS — O9962 Diseases of the digestive system complicating childbirth: Secondary | ICD-10-CM | POA: Diagnosis present

## 2016-08-01 DIAGNOSIS — O99824 Streptococcus B carrier state complicating childbirth: Secondary | ICD-10-CM | POA: Diagnosis present

## 2016-08-01 DIAGNOSIS — O9952 Diseases of the respiratory system complicating childbirth: Secondary | ICD-10-CM | POA: Diagnosis present

## 2016-08-01 DIAGNOSIS — O480 Post-term pregnancy: Secondary | ICD-10-CM | POA: Diagnosis present

## 2016-08-01 DIAGNOSIS — O894 Spinal and epidural anesthesia-induced headache during the puerperium: Secondary | ICD-10-CM | POA: Diagnosis not present

## 2016-08-01 DIAGNOSIS — K219 Gastro-esophageal reflux disease without esophagitis: Secondary | ICD-10-CM | POA: Diagnosis present

## 2016-08-01 DIAGNOSIS — Z6841 Body Mass Index (BMI) 40.0 and over, adult: Secondary | ICD-10-CM

## 2016-08-01 LAB — CBC
HCT: 35.3 % (ref 35.0–47.0)
HEMOGLOBIN: 12.1 g/dL (ref 12.0–16.0)
MCH: 29.8 pg (ref 26.0–34.0)
MCHC: 34.3 g/dL (ref 32.0–36.0)
MCV: 86.7 fL (ref 80.0–100.0)
PLATELETS: 166 10*3/uL (ref 150–440)
RBC: 4.08 MIL/uL (ref 3.80–5.20)
RDW: 14 % (ref 11.5–14.5)
WBC: 12.1 10*3/uL — ABNORMAL HIGH (ref 3.6–11.0)

## 2016-08-01 LAB — GLUCOSE, RANDOM: GLUCOSE: 99 mg/dL (ref 65–99)

## 2016-08-01 LAB — TYPE AND SCREEN
ABO/RH(D): O POS
Antibody Screen: NEGATIVE

## 2016-08-01 LAB — GLUCOSE, CAPILLARY: GLUCOSE-CAPILLARY: 92 mg/dL (ref 65–99)

## 2016-08-01 MED ORDER — AMMONIA AROMATIC IN INHA
RESPIRATORY_TRACT | Status: AC
  Filled 2016-08-01: qty 10

## 2016-08-01 MED ORDER — OXYTOCIN 10 UNIT/ML IJ SOLN
INTRAMUSCULAR | Status: AC
  Filled 2016-08-01: qty 2

## 2016-08-01 MED ORDER — SODIUM CHLORIDE 0.9 % IV SOLN: 250.0000 mL | INTRAVENOUS | Status: DC | PRN

## 2016-08-01 MED ORDER — MISOPROSTOL 25 MCG QUARTER TABLET
25.0000 ug | ORAL_TABLET | ORAL | Status: DC | PRN
  Administered 2016-08-01 – 2016-08-02 (×2): 25 ug via VAGINAL
  Filled 2016-08-01 (×2): qty 1

## 2016-08-01 MED ORDER — SODIUM CHLORIDE FLUSH 0.9 % IV SOLN
INTRAVENOUS | Status: AC
  Administered 2016-08-02: 3 mL via INTRAVENOUS
  Filled 2016-08-01: qty 20

## 2016-08-01 MED ORDER — TERBUTALINE SULFATE 1 MG/ML IJ SOLN: 0.2500 mg | Freq: Once | INTRAMUSCULAR | Status: DC | PRN

## 2016-08-01 MED ORDER — LACTATED RINGERS IV SOLN
500.0000 mL | INTRAVENOUS | Status: DC | PRN
  Administered 2016-08-01 – 2016-08-02 (×3): 1000 mL via INTRAVENOUS

## 2016-08-01 MED ORDER — LIDOCAINE HCL (PF) 1 % IJ SOLN
INTRAMUSCULAR | Status: AC
  Filled 2016-08-01: qty 30

## 2016-08-01 MED ORDER — BUTORPHANOL TARTRATE 1 MG/ML IJ SOLN
1.0000 mg | INTRAMUSCULAR | Status: DC | PRN
  Administered 2016-08-02: 1 mg via INTRAVENOUS
  Filled 2016-08-01: qty 1

## 2016-08-01 MED ORDER — SOD CITRATE-CITRIC ACID 500-334 MG/5ML PO SOLN
30.0000 mL | ORAL | Status: DC | PRN
  Filled 2016-08-01: qty 30

## 2016-08-01 MED ORDER — SODIUM CHLORIDE 0.9% FLUSH
3.0000 mL | Freq: Two times a day (BID) | INTRAVENOUS | Status: DC
  Administered 2016-08-01 – 2016-08-02 (×2): 3 mL via INTRAVENOUS

## 2016-08-01 MED ORDER — ACETAMINOPHEN 325 MG PO TABS
650.0000 mg | ORAL_TABLET | ORAL | Status: DC | PRN
  Filled 2016-08-01: qty 2

## 2016-08-01 MED ORDER — MISOPROSTOL 200 MCG PO TABS
ORAL_TABLET | ORAL | Status: AC
  Administered 2016-08-02: 25 ug via VAGINAL
  Filled 2016-08-01: qty 4

## 2016-08-01 MED ORDER — LIDOCAINE HCL (PF) 1 % IJ SOLN
30.0000 mL | INTRAMUSCULAR | Status: DC | PRN
  Administered 2016-08-02: 30 mL via SUBCUTANEOUS

## 2016-08-01 MED ORDER — SODIUM CHLORIDE 0.9 % IV SOLN
2.0000 g | Freq: Once | INTRAVENOUS | Status: AC
  Administered 2016-08-01: 2 g via INTRAVENOUS
  Filled 2016-08-01: qty 2000

## 2016-08-01 MED ORDER — ONDANSETRON HCL 4 MG/2ML IJ SOLN
4.0000 mg | Freq: Four times a day (QID) | INTRAMUSCULAR | Status: DC | PRN
  Administered 2016-08-02 (×2): 4 mg via INTRAVENOUS
  Filled 2016-08-01 (×2): qty 2

## 2016-08-01 NOTE — Progress Notes (Signed)
Shift report received from B. Gean Quint, RN. Will continue with established plan of care. Monitoring, Cytotec and antibiotics as scheduled.

## 2016-08-02 ENCOUNTER — Inpatient Hospital Stay: Payer: BLUE CROSS/BLUE SHIELD | Admitting: Registered Nurse

## 2016-08-02 DIAGNOSIS — O480 Post-term pregnancy: Secondary | ICD-10-CM

## 2016-08-02 DIAGNOSIS — Z3A41 41 weeks gestation of pregnancy: Secondary | ICD-10-CM

## 2016-08-02 LAB — GLUCOSE, CAPILLARY
GLUCOSE-CAPILLARY: 122 mg/dL — AB (ref 65–99)
Glucose-Capillary: 115 mg/dL — ABNORMAL HIGH (ref 65–99)

## 2016-08-02 MED ORDER — TERBUTALINE SULFATE 1 MG/ML IJ SOLN: 0.2500 mg | Freq: Once | INTRAMUSCULAR | Status: DC | PRN

## 2016-08-02 MED ORDER — METHYLERGONOVINE MALEATE 0.2 MG/ML IJ SOLN
INTRAMUSCULAR | Status: AC
  Filled 2016-08-02: qty 1

## 2016-08-02 MED ORDER — BUPIVACAINE HCL (PF) 0.25 % IJ SOLN
INTRAMUSCULAR | Status: DC | PRN
  Administered 2016-08-02 (×3): 5 mL via EPIDURAL

## 2016-08-02 MED ORDER — CARBOPROST TROMETHAMINE 250 MCG/ML IM SOLN
INTRAMUSCULAR | Status: AC
  Filled 2016-08-02: qty 1

## 2016-08-02 MED ORDER — LACTATED RINGERS IV SOLN: 500.0000 mL | Freq: Once | INTRAVENOUS | Status: DC

## 2016-08-02 MED ORDER — EPHEDRINE 5 MG/ML INJ
10.0000 mg | INTRAVENOUS | Status: DC | PRN
  Filled 2016-08-02: qty 2

## 2016-08-02 MED ORDER — SODIUM CHLORIDE 0.9 % IV SOLN
1.0000 g | INTRAVENOUS | Status: DC
  Administered 2016-08-02 (×5): 1 g via INTRAVENOUS
  Filled 2016-08-02 (×8): qty 1000

## 2016-08-02 MED ORDER — EPHEDRINE 5 MG/ML INJ
10.0000 mg | INTRAVENOUS | Status: DC | PRN
Start: 2016-08-02 — End: 2016-08-03
  Filled 2016-08-02: qty 2

## 2016-08-02 MED ORDER — LACTATED RINGERS IV SOLN
INTRAVENOUS | Status: DC
  Administered 2016-08-02: 16:00:00 via INTRAVENOUS

## 2016-08-02 MED ORDER — OXYTOCIN 40 UNITS IN LACTATED RINGERS INFUSION - SIMPLE MED
INTRAVENOUS | Status: AC
  Administered 2016-08-02: 2 m[IU]/min via INTRAVENOUS
  Filled 2016-08-02: qty 1000

## 2016-08-02 MED ORDER — FENTANYL 2.5 MCG/ML W/ROPIVACAINE 0.2% IN NS 100 ML EPIDURAL INFUSION (ARMC-ANES)
EPIDURAL | Status: AC
  Filled 2016-08-02: qty 100

## 2016-08-02 MED ORDER — SODIUM CHLORIDE 0.9 % IV SOLN: INTRAVENOUS | Status: DC

## 2016-08-02 MED ORDER — FENTANYL 2.5 MCG/ML W/ROPIVACAINE 0.2% IN NS 100 ML EPIDURAL INFUSION (ARMC-ANES)
EPIDURAL | Status: DC | PRN
  Administered 2016-08-02: 10 mL/h via EPIDURAL

## 2016-08-02 MED ORDER — DIPHENHYDRAMINE HCL 50 MG/ML IJ SOLN: 12.5000 mg | INTRAMUSCULAR | Status: DC | PRN

## 2016-08-02 MED ORDER — FENTANYL 2.5 MCG/ML W/ROPIVACAINE 0.2% IN NS 100 ML EPIDURAL INFUSION (ARMC-ANES): 10.0000 mL/h | EPIDURAL | Status: DC

## 2016-08-02 MED ORDER — LIDOCAINE-EPINEPHRINE (PF) 1.5 %-1:200000 IJ SOLN
INTRAMUSCULAR | Status: DC | PRN
  Administered 2016-08-02: 3 mL via PERINEURAL

## 2016-08-02 MED ORDER — SODIUM CHLORIDE 0.9 % IV SOLN
INTRAVENOUS | Status: DC | PRN
  Administered 2016-08-02: 3 mL via EPIDURAL

## 2016-08-02 MED ORDER — FENTANYL CITRATE (PF) 100 MCG/2ML IJ SOLN
25.0000 ug | INTRAMUSCULAR | Status: DC | PRN
  Administered 2016-08-02: 25 ug via INTRAVENOUS
  Filled 2016-08-02: qty 2

## 2016-08-02 MED ORDER — PHENYLEPHRINE 40 MCG/ML (10ML) SYRINGE FOR IV PUSH (FOR BLOOD PRESSURE SUPPORT)
80.0000 ug | PREFILLED_SYRINGE | INTRAVENOUS | Status: DC | PRN
  Filled 2016-08-02: qty 5

## 2016-08-02 MED ORDER — AMPICILLIN SODIUM 1 G IJ SOLR
INTRAMUSCULAR | Status: AC
  Administered 2016-08-02: 1 g via INTRAVENOUS
  Filled 2016-08-02: qty 1000

## 2016-08-02 MED ORDER — LIDOCAINE HCL (PF) 1 % IJ SOLN
INTRAMUSCULAR | Status: DC | PRN
  Administered 2016-08-02 (×2): 3 mL via SUBCUTANEOUS

## 2016-08-02 MED ORDER — OXYTOCIN 40 UNITS IN LACTATED RINGERS INFUSION - SIMPLE MED
1.0000 m[IU]/min | INTRAVENOUS | Status: DC
  Administered 2016-08-02: 2 m[IU]/min via INTRAVENOUS
  Filled 2016-08-02: qty 1000

## 2016-08-02 NOTE — Progress Notes (Addendum)
Pt back in bed from restroom, RN at bedside to adjust telemetry monitors, intermittent period of poor tracing with maternal HR tracing while pt oob in restroom or with ambulating in room. FHR 120's

## 2016-08-02 NOTE — Progress Notes (Signed)
Brooke Fuller is a 31 y.o. G1P0 at [redacted]w[redacted]d by LMP verified by first trimester [place] admitted for induction of labor due to Post dates. Due date 07/25/2016 and Gestational diabetes, well controlled by diet.  Subjective:  Pt doing well, reports increased vaginal pressure with the urge to push during contractions. Adequate pain relief with change in epidural rate. Good fetal movement.   Denies difficulty breathing or respiratory distress, chest pain, abdominal pain, and leg pain or swelling.    Objective:  Temp:  [97.7 F (36.5 C)-98.3 F (36.8 C)] 98.1 F (36.7 C) (04/30 1612) Pulse Rate:  [81-139] 99 (04/30 1612) Resp:  [16-20] 20 (04/30 0748) BP: (98-140)/(32-86) 98/51 (04/30 1612) SpO2:  [95 %-100 %] 98 % (04/30 1425) Weight:  [275 lb (124.7 kg)] 275 lb (124.7 kg) (04/29 2044)  Total I/O In: 3984 [I.V.:3784; IV Piggyback:200] Out: -   FHT:  FHR: 145 bpm, variability: moderate,  accelerations:  Present,  decelerations:  Present variables, resolved with position change.   UC:   regular, every two (2) to three (3) minutes; Pitocin infusion at 8 mu/mL  SVE:   Dilation: 10 Effacement (%): 100 Station: -2 Exam by:: k. yates, rn  Labs: Lab Results  Component Value Date   WBC 12.1 (H) 08/01/2016   HGB 12.1 08/01/2016   HCT 35.3 08/01/2016   MCV 86.7 08/01/2016   PLT 166 08/01/2016    Assessment:  G1P0 41+1, Induction of Labor, Postdates, GDM diet controlled, O positive, GBS positive, SROM x 17 hours, epidural  FHR Category II  Plan:  Discussed active vs passive descent, decision to labor down.   Continue orders as written.   Reassess at needed.    [name], CNM 08/02/2016, 5:25 PM

## 2016-08-02 NOTE — Anesthesia Preprocedure Evaluation (Signed)
Anesthesia Evaluation  Patient identified by MRN, date of birth, ID band Patient awake    Reviewed: Allergy & Precautions, H&P , NPO status   History of Anesthesia Complications Negative for: history of anesthetic complications  Airway Mallampati: III  TM Distance: >3 FB Neck ROM: full    Dental  (+) Teeth Intact   Pulmonary asthma ,           Cardiovascular      Neuro/Psych Seizures -, Well Controlled,     GI/Hepatic GERD  Controlled,  Endo/Other  diabetes, Gestational  Renal/GU      Musculoskeletal   Abdominal   Peds  Hematology   Anesthesia Other Findings   Reproductive/Obstetrics (+) Pregnancy                             Anesthesia Physical Anesthesia Plan  ASA: II  Anesthesia Plan: Epidural   Post-op Pain Management:    Induction:   Airway Management Planned:   Additional Equipment:   Intra-op Plan:   Post-operative Plan:   Informed Consent: I have reviewed the patients History and Physical, chart, labs and discussed the procedure including the risks, benefits and alternatives for the proposed anesthesia with the patient or authorized representative who has indicated his/her understanding and acceptance.   Dental Advisory Given  Plan Discussed with: Anesthesiologist and CRNA  Anesthesia Plan Comments:         Anesthesia Quick Evaluation

## 2016-08-02 NOTE — H&P (Signed)
 @  History and Physical   HPI  Brooke Fuller is a 31 y.o. G1P0 at [redacted]w[redacted]d Estimated Date of Delivery: 07/25/16 admitted  for induction of labor. OB History  Obstetric History   G1   P0   T0   P0   A0   L0    SAB0   TAB0   Ectopic0   Multiple0   Live Births0     # Outcome Date GA Lbr Len/2nd Weight Sex Delivery Anes PTL Lv  1 Current             Obstetric Comments  Adopted son. 62month now    PROBLEM LIST  Pregnancy complications or risks: Patient Active Problem List   Diagnosis Date Noted  . Labor and delivery, indication for care 08/01/2016  . Gestational diabetes 07/12/2016  . Morbid obesity with body mass index (BMI) of 45.0 to 49.9 in adult [facility]) 12/30/2015  . Rubella non-immune status, antepartum 12/30/2015  . Asthma, history 12/16/2015  . History of seizures 12/16/2015    Prenatal labs and studies: ABO, Rh: --/--/O POS (04/29 2117) Antibody: NEG (04/29 2117) Rubella: 0.97 (09/21 1029) RPR: Non Reactive (09/21 1029)  HBsAg: Negative (09/21 1029)  HIV: Non Reactive (09/21 1029)  MVH:QIONGEXB (03/29 1501) 1 hr Glucola  139  Genetic screening Low risk female Anatomy US normal  Prenatal Transfer Tool  Maternal Diabetes: Yes:  Diabetes Type:  Diet controlled Genetic Screening: Normal Maternal Ultrasounds/Referrals: Normal Fetal Ultrasounds or other Referrals:  None Maternal Substance Abuse:  No Significant Maternal Medications:  None Significant Maternal Lab Results: None   Past Medical History:  Diagnosis Date  . Gestational diabetes   . PCOS (polycystic ovarian syndrome)   . PCOS (polycystic ovarian syndrome)   . Seizures (HCC)      Past Surgical History:  Procedure Laterality Date  . NO PAST SURGERIES       Medications    Current Discharge Medication List    CONTINUE these medications which have NOT CHANGED   Details  DICLEGIS 10-10 MG TBEC TAKE 2 TABLETS BY MOUTH AT BEDTIME AS NEEDED. Qty: 60 tablet, Refills: 2    Prenatal Vit-Fe  Fumarate-FA (PRENATAL MULTIVITAMIN) TABS tablet Take 1 tablet by mouth daily at 12 noon.         Allergies  Red dye  Review of Systems  Constitutional: negative Eyes: negative Ears, nose, mouth, throat, and face: negative Respiratory: negative Cardiovascular: negative Gastrointestinal: negative Genitourinary:negative Integument/breast: negative Hematologic/lymphatic: negative Musculoskeletal:negative Neurological: negative Behavioral/Psych: negative Endocrine: negative Allergic/Immunologic: negative  Physical Exam  BP (!) 135/43   Pulse (!) 116   Temp 98.2 F (36.8 C) (Oral)   Resp 20   Ht  (1.626 m)   Wt 275 lb (124.7 kg)   LMP 10/19/2015 (Approximate) Comment: PCOS  SpO2 100%   BMI 47.20 kg/m   Lungs:  CTA B Cardio: RRR without M/R/G Abd: Soft, gravid, NT Presentation: cephalic EXT: No C/C/ 1+ Edema DTRs: 2+ B CERVIX: not evaluated  See Prenatal records for more detailed PE.     FHR:  Baseline: 125 bpm, Variability: Good {> 6 bpm), Accelerations: Reactive and Decelerations: Absent  Toco: Uterine Contractions: irregular   Test Results  Results for orders placed or performed during the hospital encounter of 08/01/16 (from the past 24 hour(s))  CBC     Status: Abnormal   Collection Time: 08/01/16  9:17 PM  Result Value Ref Range   WBC 12.1 (H) 3.6 - 11.0 K/uL  RBC 4.08 3.80 - 5.20 MIL/uL   Hemoglobin 12.1 12.0 - 16.0 g/dL   HCT 16.1 09.6 - 04.5 %   MCV 86.7 80.0 - 100.0 fL   MCH 29.8 26.0 - 34.0 pg   MCHC 34.3 32.0 - 36.0 g/dL   RDW 40.9 81.1 - 91.4 %   Platelets 166 150 - 440 K/uL  Glucose, serum     Status: None   Collection Time: 08/01/16  9:17 PM  Result Value Ref Range   Glucose, Bld 99 65 - 99 mg/dL  Type and screen     Status: None   Collection Time: 08/01/16  9:17 PM  Result Value Ref Range   ABO/RH(D) O POS    Antibody Screen NEG    Sample Expiration 08/04/2016   Glucose, capillary     Status: None   Collection Time:  08/01/16  9:34 PM  Result Value Ref Range   Glucose-Capillary 92 65 - 99 mg/dL   Comment 1 Notify RN   Glucose, capillary     Status: Abnormal   Collection Time: 08/02/16  7:45 AM  Result Value Ref Range   Glucose-Capillary 115 (H) 65 - 99 mg/dL   Group B Strep positive  Assessment   G1P0 at [redacted]w[redacted]d Estimated Date of Delivery: 07/25/16  The fetus is reassuring. Category 1  Patient Active Problem List   Diagnosis Date Noted  . Labor and delivery, indication for care 08/01/2016  . Gestational diabetes 07/12/2016  . Morbid obesity with body mass index (BMI) of 45.0 to 49.9 in adult [facility]) 12/30/2015  . Rubella non-immune status, antepartum 12/30/2015  . Asthma, history 12/16/2015  . History of seizures 12/16/2015    Plan  1. Admited to L&D 08/01/16 for cervical ripening with Cytotec. Continue present management, pt getting epidural. Verbal order given to RN to start pitocin augmentation if contractions become irregular.  2. NWG:NFAOZHYQ-- Category 1 3. Epidural if desired. Fentnyl for IV pain until epidural requested. 4. GBS prophylaxis as ordered   Doreene Burke, CNM 08/02/2016

## 2016-08-02 NOTE — Anesthesia Procedure Notes (Signed)
Epidural Patient location during procedure: OB Start time: 08/02/2016 9:05 AM End time: 08/02/2016 9:13 AM  Staffing Anesthesiologist: Naomie Dean Resident/CRNA: Karoline Caldwell Performed: resident/CRNA   Preanesthetic Checklist Completed: patient identified, site marked, surgical consent, pre-op evaluation, timeout performed, IV checked, risks and benefits discussed and monitors and equipment checked  Epidural Patient position: sitting Prep: ChloraPrep Patient monitoring: heart rate, continuous pulse ox and blood pressure Approach: midline Location: L4-L5 Injection technique: LOR saline  Needle:  Needle type: Tuohy  Needle gauge: 17 G Needle length: 9 cm and 9 Needle insertion depth: 8 cm Catheter type: closed end flexible Catheter size: 19 Gauge Catheter at skin depth: 14 cm Test dose: negative and 1.5% lidocaine with Epi 1:200 K  Assessment Events: blood not aspirated, injection not painful, no injection resistance, negative IV test and no paresthesia  Additional Notes  Pt. Evaluated and documentation done after procedure finished. Patient identified. Risks/Benefits/Options discussed with patient including but not limited to bleeding, infection, nerve damage, paralysis, failed block, incomplete pain control, headache, blood pressure changes, nausea, vomiting, reactions to medication both or allergic, itching and postpartum back pain. Confirmed with bedside nurse the patient's most recent platelet count. Confirmed with patient that they are not currently taking any anticoagulation, have any bleeding history or any family history of bleeding disorders. Patient expressed understanding and wished to proceed. All questions were answered. Sterile technique was used throughout the entire procedure. Please see nursing notes for vital signs. Test dose was given through epidural catheter and negative prior to continuing to dose epidural or start infusion. Warning signs of high block  given to the patient including shortness of breath, tingling/numbness in hands, complete motor block, or any concerning symptoms with instructions to call for help. Patient was given instructions on fall risk and not to get out of bed. All questions and concerns addressed with instructions to call with any issues or inadequate analgesia.   Patient tolerated the insertion well without immediate complications.Reason for block:procedure for pain

## 2016-08-02 NOTE — Progress Notes (Signed)
28 - Spoke with A. Janee Morn, CNM about pt labor progress, made aware of SROM, small amt clear fluid when pt stood up to go into restroom around 0030. Pt still feeling mild abdominal tightening and some cramping but tolerable. CNM would like pt to continue with additional doses of Cytotec and if pt not contracting well she may have increased dose of Cytotec to  if needed with 3rd dose at 0550a.

## 2016-08-02 NOTE — Progress Notes (Signed)
Brooke Fuller is a 31 y.o. G1P0 at [redacted]w[redacted]d by LMP and verified with first trimester ultrasound admitted for induction of labor due to Post dates. Due date 07/25/2016 and Gestational diabetes.  Subjective:  Pt resting quietly in bed with eyes closed, spouse at bedside. Reports adequate pain relief since epidural placement. She reports good fetal movement.   Denies difficulty breathing or respiratory distress, chest pain, abdominal pain, and leg pain or swelling.   Objective:  Temp:  [97.7 F (36.5 C)-98.3 F (36.8 C)] 97.9 F (36.6 C) (04/30 1002) Pulse Rate:  [81-139] 96 (04/30 1002) Resp:  [16-20] 20 (04/30 0748) BP: (104-140)/(32-86) 104/32 (04/30 1002) SpO2:  [96 %-100 %] 100 % (04/30 1030) Weight:  [275 lb (124.7 kg)] 275 lb (124.7 kg) (04/29 2044)  Total I/O In: 1400 [I.V.:1300; IV Piggyback:100] Out: -   FHT:  FHR: 130 bpm, variability: moderate,  accelerations:  Present,  decelerations:  Present prolonged, resolved with position change.   UC:   none  SVE:   Dilation: 6 Effacement (%): 80 Station: -2 Exam by:: .Zerita Boers, RN at 1025 08/02/2016  Labs: Lab Results  Component Value Date   WBC 12.1 (H) 08/01/2016   HGB 12.1 08/01/2016   HCT 35.3 08/01/2016   MCV 86.7 08/01/2016   PLT 166 08/01/2016    Assessment:  G1P0 41+1, Induction of Labor, Postdates, GDM diet controlled, O positive, GBS positive, SROM x 12 hours, epidural  FHR Category II  Plan:  Pitocin augmentation, see orders. Start when strip reactive for twenty minutes follow spontaneous prolonged deceleration.  Continue orders as written.   Reassess as needed.    [name], CNM 08/02/2016, 11:55 AM

## 2016-08-03 ENCOUNTER — Inpatient Hospital Stay: Payer: BLUE CROSS/BLUE SHIELD | Admitting: Anesthesiology

## 2016-08-03 LAB — CBC
HCT: 33.4 % — ABNORMAL LOW (ref 35.0–47.0)
HEMOGLOBIN: 11.1 g/dL — AB (ref 12.0–16.0)
MCH: 29 pg (ref 26.0–34.0)
MCHC: 33.3 g/dL (ref 32.0–36.0)
MCV: 87.1 fL (ref 80.0–100.0)
Platelets: 158 10*3/uL (ref 150–440)
RBC: 3.83 MIL/uL (ref 3.80–5.20)
RDW: 14.4 % (ref 11.5–14.5)
WBC: 17 10*3/uL — ABNORMAL HIGH (ref 3.6–11.0)

## 2016-08-03 LAB — COMPREHENSIVE METABOLIC PANEL
ALBUMIN: 2.2 g/dL — AB (ref 3.5–5.0)
ALK PHOS: 70 U/L (ref 38–126)
ALT: 10 U/L — ABNORMAL LOW (ref 14–54)
ANION GAP: 5 (ref 5–15)
AST: 23 U/L (ref 15–41)
BUN: 12 mg/dL (ref 6–20)
CALCIUM: 8.4 mg/dL — AB (ref 8.9–10.3)
CHLORIDE: 111 mmol/L (ref 101–111)
CO2: 23 mmol/L (ref 22–32)
Creatinine, Ser: 0.47 mg/dL (ref 0.44–1.00)
GFR calc Af Amer: >60 mL/min (ref >60)
GFR calc non Af Amer: >60 mL/min (ref >60)
GLUCOSE: 83 mg/dL (ref 65–99)
POTASSIUM: 3.9 mmol/L (ref 3.5–5.1)
SODIUM: 139 mmol/L (ref 135–145)
Total Bilirubin: 0.5 mg/dL (ref 0.3–1.2)
Total Protein: 5.2 g/dL — ABNORMAL LOW (ref 6.5–8.1)

## 2016-08-03 LAB — VITAMIN B12: Vitamin B-12: 131 pg/mL — ABNORMAL LOW (ref 180–914)

## 2016-08-03 LAB — RPR: RPR Ser Ql: NONREACTIVE

## 2016-08-03 MED ORDER — ONDANSETRON HCL 4 MG PO TABS: 4.0000 mg | ORAL_TABLET | ORAL | Status: DC | PRN

## 2016-08-03 MED ORDER — IBUPROFEN 600 MG PO TABS
600.0000 mg | ORAL_TABLET | Freq: Four times a day (QID) | ORAL | Status: DC
  Administered 2016-08-03 (×2): 600 mg via ORAL
  Filled 2016-08-03 (×2): qty 1

## 2016-08-03 MED ORDER — LACTATED RINGERS IV SOLN
Freq: Once | INTRAVENOUS | Status: AC
  Administered 2016-08-03: 13:00:00 via INTRAVENOUS

## 2016-08-03 MED ORDER — COCONUT OIL OIL
1.0000 "application " | TOPICAL_OIL | Status: DC | PRN
  Administered 2016-08-04: 1 via TOPICAL
  Filled 2016-08-03: qty 120

## 2016-08-03 MED ORDER — SIMETHICONE 80 MG PO CHEW: 80.0000 mg | CHEWABLE_TABLET | ORAL | Status: DC | PRN

## 2016-08-03 MED ORDER — BENZOCAINE-MENTHOL 20-0.5 % EX AERO
1.0000 "application " | INHALATION_SPRAY | CUTANEOUS | Status: DC | PRN
  Administered 2016-08-04: 1 via TOPICAL
  Filled 2016-08-03: qty 56

## 2016-08-03 MED ORDER — IBUPROFEN 600 MG PO TABS
ORAL_TABLET | ORAL | Status: AC
  Filled 2016-08-03: qty 1

## 2016-08-03 MED ORDER — IBUPROFEN 600 MG PO TABS
600.0000 mg | ORAL_TABLET | Freq: Four times a day (QID) | ORAL | Status: DC
  Filled 2016-08-03: qty 1

## 2016-08-03 MED ORDER — ACETAMINOPHEN 325 MG PO TABS
650.0000 mg | ORAL_TABLET | ORAL | Status: DC | PRN
  Administered 2016-08-03: 650 mg via ORAL

## 2016-08-03 MED ORDER — PRENATAL MULTIVITAMIN CH
1.0000 | ORAL_TABLET | Freq: Every day | ORAL | Status: DC
  Administered 2016-08-03 – 2016-08-04 (×2): 1 via ORAL
  Filled 2016-08-03 (×2): qty 1

## 2016-08-03 MED ORDER — IBUPROFEN 600 MG PO TABS
600.0000 mg | ORAL_TABLET | Freq: Four times a day (QID) | ORAL | Status: DC
Start: 2016-08-03 — End: 2016-08-03
  Administered 2016-08-03: 600 mg via ORAL

## 2016-08-03 MED ORDER — WITCH HAZEL-GLYCERIN EX PADS: 1.0000 "application " | MEDICATED_PAD | CUTANEOUS | Status: DC | PRN

## 2016-08-03 MED ORDER — ONDANSETRON HCL 4 MG/2ML IJ SOLN: 4.0000 mg | INTRAMUSCULAR | Status: DC | PRN

## 2016-08-03 MED ORDER — DIBUCAINE 1 % RE OINT: 1.0000 "application " | TOPICAL_OINTMENT | RECTAL | Status: DC | PRN

## 2016-08-03 MED ORDER — TETANUS-DIPHTH-ACELL PERTUSSIS 5-2.5-18.5 LF-MCG/0.5 IM SUSP: 0.5000 mL | Freq: Once | INTRAMUSCULAR | Status: DC

## 2016-08-03 NOTE — Progress Notes (Signed)
Assisted to bathroom.  When upright patient complains that headache is no better, 10/10.  When repositioned back in bed pain returns to 2/10, aching in nature-top of the head.

## 2016-08-03 NOTE — Progress Notes (Signed)
Tolerated blood patch well.  Lying supine, call light in reach.  States pain is diminished from what it was (3/10).

## 2016-08-03 NOTE — Progress Notes (Signed)
Went to evaluate patient due to HA after epidural yesterday. HA is positional in nature, patient has significantly worsening headache as well as nausea when sitting up or walking. Denies and visual or auditory symptoms. No known dural puncture but discussed case with performing providers and there was difficulty with initial placement and then the catheter also had to be replaced so it is possible that a dural puncture occurred that went unnoticed. Given her symptoms so consistent with PDPH I discussed going ahead with EBP. Discussed that this treatment is effective 80% of the time for PDPH. Discussed that sometimes EBP can initially be effective but then HA can recur and we discussed that occasionally we consider doing a second EBP if symptoms do not resolve or recur. Risks and benefits of the procedure were discussed and patient is amenable to proceeding with EBP. We will bring patient to PACU for monitoring during the procedure.

## 2016-08-03 NOTE — Anesthesia Procedure Notes (Addendum)
Epidural Patient location during procedure: post-op Start time: 08/03/2016 1:30 PM End time: 08/03/2016 1:49 PM  Staffing Anesthesiologist: Priscella Mann Alvin Rubano Resident/CRNA: Malva Cogan Performed: anesthesiologist and resident/CRNA   Preanesthetic Checklist Completed: patient identified, site marked, surgical consent, pre-op evaluation, timeout performed, IV checked, risks and benefits discussed and monitors and equipment checked  Epidural Patient position: sitting Prep: ChloraPrep Patient monitoring: heart rate, continuous pulse ox and blood pressure Approach: midline Location: L3-L4 Injection technique: LOR air  Needle:  Needle type: Tuohy  Needle gauge: 18 G Needle length: 9 cm and 9 Needle insertion depth: 7 cm Catheter type: closed end flexible Catheter size: 20 Guage  Assessment Events: blood not aspirated, injection not painful, no injection resistance, negative IV test and no paresthesia  Additional Notes EBP performed for PDPH. Epidural space obtained with tuohy needle, blood drawn sterilely from R hand, blood slowly injected with repeated aspirations through tuohy needle, pt reported mild back pressure during injections but no paresthesias, total of 15mL was able to be injected before back pressure became too significant for patient. Will have patient lay flat for 1 hour in PACU prior to return to postpartum unit.  No apparent complicationsReason for block:procedure for pain

## 2016-08-03 NOTE — OR Nursing (Signed)
Dr. Priscella Mann at bedside.  Aware of continued 2/10 headache that increases when sits up or stands. Dr. Priscella Mann states may return patient to room.  Encouraged adequate fluid intake to include caffeine.

## 2016-08-03 NOTE — Anesthesia Postprocedure Evaluation (Signed)
Anesthesia Post Note  Patient: Brooke Fuller  Procedure(s) Performed: * No procedures listed *  Patient location during evaluation: Mother Baby Anesthesia Type: Epidural Level of consciousness: awake and alert and oriented Pain management: satisfactory to patient Vital Signs Assessment: post-procedure vital signs reviewed and stable Respiratory status: respiratory function stable Cardiovascular status: blood pressure returned to baseline Postop Assessment: epidural receding, patient able to bend at knees, no signs of nausea or vomiting and adequate PO intake Comments: Pt complaining of headache when sitting upright.  Encouraged to push fluids and lay supine as much as possible.  Will round on pt this afternoon.     Last Vitals:  Vitals:   08/03/16 0010 08/03/16 0407  BP: 135/79 (!) 108/57  Pulse: (!) 131 (!) 101  Resp:  18  Temp:  36.7 C    Last Pain:  Vitals:   08/03/16 0407  TempSrc: Oral  PainSc:                  Clydene Pugh

## 2016-08-03 NOTE — Progress Notes (Signed)
Post Partum Day 1  Brooke Fuller is a 31 year old G1P1, admitted 08/01/2016 for induction of labor due to postdates pregnancy and gestational diabetes controlled by diet.   Subjective:  Pt sitting in bed breastfeeding newborn, FOB at bedside. Reports vaginal soreness, but denies other pain at this time.   Denies difficulty breathing or respiratory distress, chest pain, abdominal pain, and leg or calf pain.   Objective:  Temp:  [97.8 F (36.6 C)-98.6 F (37 C)] 98 F (36.7 C) (05/01 0818) Pulse Rate:  [91-139] 97 (05/01 0818) Resp:  [18-20] 20 (05/01 0818) BP: (98-163)/(32-98) 99/75 (05/01 0818) SpO2:  [95 %-100 %] 99 % (05/01 0818)  Physical Exam:   General: alert and cooperative  Lochia: appropriate  Uterine Fundus: firm  Episiotomy: healing well, no significant erythema  DVT Evaluation: No evidence of DVT seen on physical exam. Negative Homan's sign. Calf/Ankle edema is present.   Recent Labs  08/01/16 2117 08/03/16 0711  HGB 12.1 11.1*  HCT 35.3 33.4*   CMP     Component Value Date/Time   NA 139 08/03/2016 0711   K 3.9 08/03/2016 0711   CL 111 08/03/2016 0711   CO2 23 08/03/2016 0711   GLUCOSE 83 08/03/2016 0711   BUN 12 08/03/2016 0711   CREATININE 0.47 08/03/2016 0711   CALCIUM 8.4 (L) 08/03/2016 0711   PROT 5.2 (L) 08/03/2016 0711   ALBUMIN 2.2 (L) 08/03/2016 0711   AST 23 08/03/2016 0711   ALT 10 (L) 08/03/2016 0711   ALKPHOS 70 08/03/2016 0711   BILITOT 0.5 08/03/2016 0711   GFRNONAA >60 08/03/2016 0711   GFRAA >60 08/03/2016 0711   Recent Labs     08/03/16  0711  VITAMINB12  131*     VITAMIN D 25 Hydroxy (Vit-D Deficiency, Fractures)    Ref Range & Units 1d ago  Vit D, 25-Hydroxy 30.0 - 100.0 ng/mL 32.2        Assessment:  G1P1 s/p vaginal delivery-vacuum assisted, GDM-diet controlled, O positive  Breastfeeding  Plan:  Reviewed routine postpartum care and instructions, including red flag symptoms and when to call.    Anticipate discharge in tomorrow.  Continue orders as written. Reassess as needed.    LOS: 2 days    [name], CNM 08/03/2016, 8:20 AM

## 2016-08-04 LAB — VITAMIN D 25 HYDROXY (VIT D DEFICIENCY, FRACTURES): VIT D 25 HYDROXY: 32.2 ng/mL (ref 30.0–100.0)

## 2016-08-04 MED ORDER — IBUPROFEN 600 MG PO TABS: 600.0000 mg | ORAL_TABLET | Freq: Four times a day (QID) | ORAL | 0 refills | Status: AC

## 2016-08-04 MED ORDER — SIMETHICONE 80 MG PO CHEW: 80.0000 mg | CHEWABLE_TABLET | ORAL | 0 refills | Status: AC | PRN

## 2016-08-04 NOTE — Progress Notes (Signed)
Discharge order received from doctor. MMR vaccine explained and offered to patient. Patient declined MMR vaccine.Reviewed discharge instructions and prescriptions with patient and answered all questions. Follow up appointment instructions given. Patient verbalized understanding. ID bands checked. Patient discharged home with infant via wheelchair by nursing/auxillary.    Hilbert Bible, RN

## 2016-08-04 NOTE — Addendum Note (Signed)
Addendum  created 08/04/16 1610 by Junious Silk, CRNA   Sign clinical note

## 2016-08-04 NOTE — Lactation Note (Signed)
This note was copied from a baby's chart. Lactation Consultation Note  Patient Name: Brooke Fuller [id number] Date: 08/04/2016  During my LC rounds this morning, Mom stated that breastfeeding was going "much better today". I encouraged her to call me if I can review any educational topics and give her any assistance. She has LC contact info and [facility] support group info.    Maternal Data Formula Feeding for Exclusion: No Has patient been taught Hand Expression?: Yes Does the patient have breastfeeding experience prior to this delivery?: No  Feeding Feeding Type: Breast Fed Length of feed: 30 min (per moms feeding log)  LATCH Score/Interventions                      Lactation Tools Discussed/Used     Consult Status      Sunday Corn 08/04/2016, 12:32 PM

## 2016-08-04 NOTE — Discharge Summary (Signed)
Obstetric Discharge Summary  Patient ID: Brooke Fuller MRN: [id number] DOB/AGE: May 11, 1985 31 y.o.   Date of Admission: 08/01/2016 Doreene Burke, CNM Priscille Loveless, MD)  Date of Discharge: 08/04/2016 [name], CNM Charlena Cross, MD)  Admitting Diagnosis: Induction of labor at [redacted]w[redacted]d  Secondary Diagnosis: Gestational diabetes diet controlled (A1) and Postdates pregnancy  Mode of Delivery: vacuum-assisted vaginal delivery     Discharge Diagnosis: Spinal headache, s/p vacuum assisted vaginal delivery   Intrapartum Procedures: epidural, episiotomy midline, GBS prophylaxis, pitocin augmentation, placement of fetal scalp electrode and vacuum extraction   Post partum procedures: blood patch for spinal headache  Complications: spinal headache  Brief Hospital Course   Brooke Fuller is a G1P0 who had a SVD on 08/02/2016;  for further details of this birth, please refer to the delivey note.  Postpartum course was complicated by spinal headache that was treated with blood patch, for further details please refer to anesthesia note.  By time of discharge on PPD#2, her pain was controlled on oral pain medications; she had appropriate lochia and was ambulating, voiding without difficulty and tolerating regular diet.  She was deemed stable for discharge to home.    Labs: CBC Latest Ref Rng & Units 08/03/2016 08/01/2016 04/30/2016  WBC 3.6 - 11.0 K/uL 17.0(H) 12.1(H) -  Hemoglobin 12.0 - 16.0 g/dL 11.1(L) 12.1 -  Hematocrit 35.0 - 47.0 % 33.4(L) 35.3 36.2  Platelets 150 - 440 K/uL 158 166 -   O POS  Physical exam:   Temp:  [97.6 F (36.4 C)-98.3 F (36.8 C)] 97.6 F (36.4 C) (05/02 0750) Pulse Rate:  [79-101] 91 (05/02 0750) Resp:  [16-30] 20 (05/02 0750) BP: (113-146)/(65-112) 133/74 (05/02 0750) SpO2:  [95 %-100 %] 99 % (05/02 0750)  General: alert and no distress  Lochia: appropriate  Abdomen: soft, NT  Uterine Fundus: firm  Episiotomy: healing well, no significant drainage,  no dehiscence, no significant erythema  Extremities: No evidence of DVT seen on physical exam. Mild, non-pitting lower extremity edema present.Negative Homans sign.   Discharge Instructions: Per After Visit Summary.  Activity: Advance as tolerated. Pelvic rest for 6 weeks.  Also refer to After Visit Summary  Diet: Regular  Medications: Allergies as of 08/04/2016      Reactions   Red Dye Other (See Comments)   Severe nausea and vomiting      Medication List    TAKE these medications   ibuprofen 600 MG tablet Commonly known as:  ADVIL,MOTRIN Take 1 tablet (600 mg total) by mouth every 6 (six) hours.   prenatal multivitamin Tabs tablet Take 1 tablet by mouth daily at 12 noon.   simethicone 80 MG chewable tablet Commonly known as:  MYLICON Chew 1 tablet (80 mg total) by mouth as needed for flatulence.      Outpatient follow up:  Follow-up Information    [name], CNM. Schedule an appointment as soon as possible for a visit in 6 week(s).   Specialties:  Certified Nurse Midwife, Obstetrics and Gynecology, Radiology Why:  Please call and schedule postpartum appointment.  Contact information: [address] [place] [address] [phone]          Postpartum contraception: vasectomy  Discharged Condition: stable  Discharged to: home  Newborn Data:  Disposition:home with mother  Apgars: APGAR (1 MIN):  7 APGAR (5 MINS):  8  Birth weight: 7 pounds, 13 ounces (3550 g)  Baby Feeding: Breast   Brooke Fuller, CNM

## 2016-08-04 NOTE — Progress Notes (Signed)
Per prenatal records patients rubella lab results were 0.97 which is equivocal.  VIS statement given to patient and explained that a MMR vaccine is recommended. Patient declined MMR vaccine.    Abigail Garner, RN  

## 2016-08-04 NOTE — Discharge Instructions (Signed)
Please call your doctor or return to the ER if you experience any chest pains, shortness of breath, visual changes, dizziness, fever greater than 101, any heavy bleeding (saturating more than 1 pad per hour), large clots, or foul smelling discharge, any worsening abdominal pain and cramping that is not controlled by pain medication, or any signs of postpartum depression. No tampons, enemas, douches, or sexual intercourse for 6 weeks. Also avoid tub baths, hot tubs, or swimming for 6 weeks.     Vaginal Delivery, Care After Refer to this sheet in the next few weeks. These instructions provide you with information about caring for yourself after vaginal delivery. Your health care provider may also give you more specific instructions. Your treatment has been planned according to current medical practices, but problems sometimes occur. Call your health care provider if you have any problems or questions. What can I expect after the procedure? After vaginal delivery, it is common to have:  Some bleeding from your vagina.  Soreness in your abdomen, your vagina, and the area of skin between your vaginal opening and your anus (perineum).  Pelvic cramps.  Fatigue. Follow these instructions at home: Medicines   Take over-the-counter and prescription medicines only as told by your health care provider.  If you were prescribed an antibiotic medicine, take it as told by your health care provider. Do not stop taking the antibiotic until it is finished. Driving    Do not drive or operate heavy machinery while taking prescription pain medicine.  Do not drive for 24 hours if you received a sedative. Lifestyle   Do not drink alcohol. This is especially important if you are breastfeeding or taking medicine to relieve pain.  Do not use tobacco products, including cigarettes, chewing tobacco, or e-cigarettes. If you need help quitting, ask your health care provider. Eating and drinking   Drink at least  8 eight-ounce glasses of water every day unless you are told not to by your health care provider. If you choose to breastfeed your baby, you may need to drink more water than this.  Eat high-fiber foods every day. These foods may help prevent or relieve constipation. High-fiber foods include:  Whole grain cereals and breads.  Brown rice.  Beans.  Fresh fruits and vegetables. Activity   Return to your normal activities as told by your health care provider. Ask your health care provider what activities are safe for you.  Rest as much as possible. Try to rest or take a nap when your baby is sleeping.  Do not lift anything that is heavier than your baby or 10 lb (4.5 kg) until your health care provider says that it is safe.  Talk with your health care provider about when you can engage in sexual activity. This may depend on your:  Risk of infection.  Rate of healing.  Comfort and desire to engage in sexual activity. Vaginal Care   If you have an episiotomy or a vaginal tear, check the area every day for signs of infection. Check for:  More redness, swelling, or pain.  More fluid or blood.  Warmth.  Pus or a bad smell.  Do not use tampons or douches until your health care provider says this is safe.  Watch for any blood clots that may pass from your vagina. These may look like clumps of dark red, brown, or black discharge. General instructions   Keep your perineum clean and dry as told by your health care provider.  Wear loose, comfortable  clothing.  Wipe from front to back when you use the toilet.  Ask your health care provider if you can shower or take a bath. If you had an episiotomy or a perineal tear during labor and delivery, your health care provider may tell you not to take baths for a certain length of time.  Wear a bra that supports your breasts and fits you well.  If possible, have someone help you with household activities and help care for your baby for at  least a few days after you leave the hospital.  Keep all follow-up visits for you and your baby as told by your health care provider. This is important. Contact a health care provider if:  You have:  Vaginal discharge that has a bad smell.  Difficulty urinating.  Pain when urinating.  A sudden increase or decrease in the frequency of your bowel movements.  More redness, swelling, or pain around your episiotomy or vaginal tear.  More fluid or blood coming from your episiotomy or vaginal tear.  Pus or a bad smell coming from your episiotomy or vaginal tear.  A fever.  A rash.  Little or no interest in activities you used to enjoy.  Questions about caring for yourself or your baby.  Your episiotomy or vaginal tear feels warm to the touch.  Your episiotomy or vaginal tear is separating or does not appear to be healing.  Your breasts are painful, hard, or turn red.  You feel unusually sad or worried.  You feel nauseous or you vomit.  You pass large blood clots from your vagina. If you pass a blood clot from your vagina, save it to show to your health care provider. Do not flush blood clots down the toilet without having your health care provider look at them.  You urinate more than usual.  You are dizzy or light-headed.  You have not breastfed at all and you have not had a menstrual period for 12 weeks after delivery.  You have stopped breastfeeding and you have not had a menstrual period for 12 weeks after you stopped breastfeeding. Get help right away if:  You have:  Pain that does not go away or does not get better with medicine.  Chest pain.  Difficulty breathing.  Blurred vision or spots in your vision.  Thoughts about hurting yourself or your baby.  You develop pain in your abdomen or in one of your legs.  You develop a severe headache.  You faint.  You bleed from your vagina so much that you fill two sanitary pads in one hour. This information is  not intended to replace advice given to you by your health care provider. Make sure you discuss any questions you have with your health care provider. Document Released: 03/19/2000 Document Revised: 09/03/2015 Document Reviewed: 04/06/2015 Elsevier Interactive Patient Education  2017 [employer].   Breastfeeding Deciding to breastfeed is one of the best choices you can make for you and your baby. A change in hormones during pregnancy causes your breast tissue to grow and increases the number and size of your milk ducts. These hormones also allow proteins, sugars, and fats from your blood supply to make breast milk in your milk-producing glands. Hormones prevent breast milk from being released before your baby is born as well as prompt milk flow after birth. Once breastfeeding has begun, thoughts of your baby, as well as his or her sucking or crying, can stimulate the release of milk from your milk-producing glands.  Benefits of breastfeeding For Your Baby  Your first milk (colostrum) helps your baby's digestive system function better.  There are antibodies in your milk that help your baby fight off infections.  Your baby has a lower incidence of asthma, allergies, and sudden infant death syndrome.  The nutrients in breast milk are better for your baby than infant formulas and are designed uniquely for your babys needs.  Breast milk improves your baby's brain development.  Your baby is less likely to develop other conditions, such as childhood obesity, asthma, or type 2 diabetes mellitus. For You  Breastfeeding helps to create a very special bond between you and your baby.  Breastfeeding is convenient. Breast milk is always available at the correct temperature and costs nothing.  Breastfeeding helps to burn calories and helps you lose the weight gained during pregnancy.  Breastfeeding makes your uterus contract to its prepregnancy size faster and slows bleeding (lochia) after you give  birth.  Breastfeeding helps to lower your risk of developing type 2 diabetes mellitus, osteoporosis, and breast or ovarian cancer later in life. Signs that your baby is hungry Early Signs of Hunger  Increased alertness or activity.  Stretching.  Movement of the head from side to side.  Movement of the head and opening of the mouth when the corner of the mouth or cheek is stroked (rooting).  Increased sucking sounds, smacking lips, cooing, sighing, or squeaking.  Hand-to-mouth movements.  Increased sucking of fingers or hands. Late Signs of Hunger  Fussing.  Intermittent crying. Extreme Signs of Hunger  Signs of extreme hunger will require calming and consoling before your baby will be able to breastfeed successfully. Do not wait for the following signs of extreme hunger to occur before you initiate breastfeeding:  Restlessness.  A loud, strong cry.  Screaming. Breastfeeding basics  Breastfeeding Initiation  Find a comfortable place to sit or lie down, with your neck and back well supported.  Place a pillow or rolled up blanket under your baby to bring him or her to the level of your breast (if you are seated). Nursing pillows are specially designed to help support your arms and your baby while you breastfeed.  Make sure that your baby's abdomen is facing your abdomen.  Gently massage your breast. With your fingertips, massage from your chest wall toward your nipple in a circular motion. This encourages milk flow. You may need to continue this action during the feeding if your milk flows slowly.  Support your breast with 4 fingers underneath and your thumb above your nipple. Make sure your fingers are well away from your nipple and your babys mouth.  Stroke your baby's lips gently with your finger or nipple.  When your baby's mouth is open wide enough, quickly bring your baby to your breast, placing your entire nipple and as much of the colored area around your nipple  (areola) as possible into your baby's mouth.  More areola should be visible above your baby's upper lip than below the lower lip.  Your baby's tongue should be between his or her lower gum and your breast.  Ensure that your baby's mouth is correctly positioned around your nipple (latched). Your baby's lips should create a seal on your breast and be turned out (everted).  It is common for your baby to suck about 2-3 minutes in order to start the flow of breast milk. Latching  Teaching your baby how to latch on to your breast properly is very important. An improper latch  can cause nipple pain and decreased milk supply for you and poor weight gain in your baby. Also, if your baby is not latched onto your nipple properly, he or she may swallow some air during feeding. This can make your baby fussy. Burping your baby when you switch breasts during the feeding can help to get rid of the air. However, teaching your baby to latch on properly is still the best way to prevent fussiness from swallowing air while breastfeeding. Signs that your baby has successfully latched on to your nipple:  Silent tugging or silent sucking, without causing you pain.  Swallowing heard between every 3-4 sucks.  Muscle movement above and in front of his or her ears while sucking. Signs that your baby has not successfully latched on to nipple:  Sucking sounds or smacking sounds from your baby while breastfeeding.  Nipple pain. If you think your baby has not latched on correctly, slip your finger into the corner of your babys mouth to break the suction and place it between your baby's gums. Attempt breastfeeding initiation again. Signs of Successful Breastfeeding  Signs from your baby:  A gradual decrease in the number of sucks or complete cessation of sucking.  Falling asleep.  Relaxation of his or her body.  Retention of a small amount of milk in his or her mouth.  Letting go of your breast by himself or  herself. Signs from you:  Breasts that have increased in firmness, weight, and size 1-3 hours after feeding.  Breasts that are softer immediately after breastfeeding.  Increased milk volume, as well as a change in milk consistency and color by the fifth day of breastfeeding.  Nipples that are not sore, cracked, or bleeding. Signs That Your Pecola Leisure is Getting Enough Milk  Wetting at least 1-2 diapers during the first 24 hours after birth.  Wetting at least 5-6 diapers every 24 hours for the first week after birth. The urine should be clear or pale yellow by 5 days after birth.  Wetting 6-8 diapers every 24 hours as your baby continues to grow and develop.  At least 3 stools in a 24-hour period by age 60 days. The stool should be soft and yellow.  At least 3 stools in a 24-hour period by age 74 days. The stool should be seedy and yellow.  No loss of weight greater than 10% of birth weight during the first 61 days of age.  Average weight gain of 4-7 ounces (113-198 g) per week after age 23 days.  Consistent daily weight gain by age 60 days, without weight loss after the age of 2 weeks. After a feeding, your baby may spit up a small amount. This is common. Breastfeeding frequency and duration Frequent feeding will help you make more milk and can prevent sore nipples and breast engorgement. Breastfeed when you feel the need to reduce the fullness of your breasts or when your baby shows signs of hunger. This is called "breastfeeding on demand." Avoid introducing a pacifier to your baby while you are working to establish breastfeeding (the first 4-6 weeks after your baby is born). After this time you may choose to use a pacifier. Research has shown that pacifier use during the first year of a baby's life decreases the risk of sudden infant death syndrome (SIDS). Allow your baby to feed on each breast as long as he or she wants. Breastfeed until your baby is finished feeding. When your baby unlatches  or falls asleep while feeding from  the first breast, offer the second breast. Because newborns are often sleepy in the first few weeks of life, you may need to awaken your baby to get him or her to feed. Breastfeeding times will vary from baby to baby. However, the following rules can serve as a guide to help you ensure that your baby is properly fed:  Newborns (babies 18 weeks of age or younger) may breastfeed every 1-3 hours.  Newborns should not go longer than 3 hours during the day or 5 hours during the night without breastfeeding.  You should breastfeed your baby a minimum of 8 times in a 24-hour period until you begin to introduce solid foods to your baby at around 81 months of age. Breast milk pumping Pumping and storing breast milk allows you to ensure that your baby is exclusively fed your breast milk, even at times when you are unable to breastfeed. This is especially important if you are going back to work while you are still breastfeeding or when you are not able to be present during feedings. Your lactation consultant can give you guidelines on how long it is safe to store breast milk. A breast pump is a machine that allows you to pump milk from your breast into a sterile bottle. The pumped breast milk can then be stored in a refrigerator or freezer. Some breast pumps are operated by hand, while others use electricity. Ask your lactation consultant which type will work best for you. Breast pumps can be purchased, but some hospitals and breastfeeding support groups lease breast pumps on a monthly basis. A lactation consultant can teach you how to hand express breast milk, if you prefer not to use a pump. Caring for your breasts while you breastfeed Nipples can become dry, cracked, and sore while breastfeeding. The following recommendations can help keep your breasts moisturized and healthy:  Avoid using soap on your nipples.  Wear a supportive bra. Although not required, special nursing  bras and tank tops are designed to allow access to your breasts for breastfeeding without taking off your entire bra or top. Avoid wearing underwire-style bras or extremely tight bras.  Air dry your nipples for 3-42minutes after each feeding.  Use only cotton bra pads to absorb leaked breast milk. Leaking of breast milk between feedings is normal.  Use lanolin on your nipples after breastfeeding. Lanolin helps to maintain your skin's normal moisture barrier. If you use pure lanolin, you do not need to wash it off before feeding your baby again. Pure lanolin is not toxic to your baby. You may also hand express a few drops of breast milk and gently massage that milk into your nipples and allow the milk to air dry. In the first few weeks after giving birth, some women experience extremely full breasts (engorgement). Engorgement can make your breasts feel heavy, warm, and tender to the touch. Engorgement peaks within 3-5 days after you give birth. The following recommendations can help ease engorgement:  Completely empty your breasts while breastfeeding or pumping. You may want to start by applying warm, moist heat (in the shower or with warm water-soaked hand towels) just before feeding or pumping. This increases circulation and helps the milk flow. If your baby does not completely empty your breasts while breastfeeding, pump any extra milk after he or she is finished.  Wear a snug bra (nursing or regular) or tank top for 1-2 days to signal your body to slightly decrease milk production.  Apply ice packs to your  breasts, unless this is too uncomfortable for you.  Make sure that your baby is latched on and positioned properly while breastfeeding. If engorgement persists after 48 hours of following these recommendations, contact your health care provider or a Advertising copywriter. Overall health care recommendations while breastfeeding  Eat healthy foods. Alternate between meals and snacks, eating 3 of  each per day. Because what you eat affects your breast milk, some of the foods may make your baby more irritable than usual. Avoid eating these foods if you are sure that they are negatively affecting your baby.  Drink milk, fruit juice, and water to satisfy your thirst (about 10 glasses a day).  Rest often, relax, and continue to take your prenatal vitamins to prevent fatigue, stress, and anemia.  Continue breast self-awareness checks.  Avoid chewing and smoking tobacco. Chemicals from cigarettes that pass into breast milk and exposure to secondhand smoke may harm your baby.  Avoid alcohol and drug use, including marijuana. Some medicines that may be harmful to your baby can pass through breast milk. It is important to ask your health care provider before taking any medicine, including all over-the-counter and prescription medicine as well as vitamin and herbal supplements. It is possible to become pregnant while breastfeeding. If birth control is desired, ask your health care provider about options that will be safe for your baby. Contact a health care provider if:  You feel like you want to stop breastfeeding or have become frustrated with breastfeeding.  You have painful breasts or nipples.  Your nipples are cracked or bleeding.  Your breasts are red, tender, or warm.  You have a swollen area on either breast.  You have a fever or chills.  You have nausea or vomiting.  You have drainage other than breast milk from your nipples.  Your breasts do not become full before feedings by the fifth day after you give birth.  You feel sad and depressed.  Your baby is too sleepy to eat well.  Your baby is having trouble sleeping.  Your baby is wetting less than 3 diapers in a 24-hour period.  Your baby has less than 3 stools in a 24-hour period.  Your baby's skin or the white part of his or her eyes becomes yellow.  Your baby is not gaining weight by 36 days of age. Get help  right away if:  Your baby is overly tired (lethargic) and does not want to wake up and feed.  Your baby develops an unexplained fever. This information is not intended to replace advice given to you by your health care provider. Make sure you discuss any questions you have with your health care provider. Document Released: 03/22/2005 Document Revised: 09/03/2015 Document Reviewed: 09/13/2012 Elsevier Interactive Patient Education  2017 [employer].

## 2016-08-04 NOTE — Anesthesia Postprocedure Evaluation (Signed)
Anesthesia Post Note  Patient: Brooke Fuller  Procedure(s) Performed: * No procedures listed *  Patient location during evaluation: Nursing Unit Anesthesia Type: Epidural Level of consciousness: awake, awake and alert and oriented Pain management: pain level controlled Respiratory status: spontaneous breathing, nonlabored ventilation and respiratory function stable Cardiovascular status: stable Postop Assessment: no backache Anesthetic complications: yes Anesthetic complication details: anesthesia complicationsComments: Pt was seen on 08-04-16 by this CRNA MRN at 0730.  Pt experienced a post dural puncture at time of epidural placement.  Pt had a blood patch on 08-03-16 by [facility] [place].  Pt reports today that symptoms are much better and she is feeling much better and will likely be d/c'd today.  Pt has full motor movement of all extremities.  No other complications noted.  MRN CRNA     Last Vitals:  Vitals:   08/03/16 1929 08/04/16 0750  BP: 113/73 133/74  Pulse: (!) 101 91  Resp: 20 20  Temp: 36.6 C 36.4 C    Last Pain:  Vitals:   08/04/16 0750  TempSrc: Oral  PainSc:                  Lyn Records

## 2016-08-06 ENCOUNTER — Telehealth: Payer: Self-pay | Admitting: Certified Nurse Midwife

## 2016-08-06 ENCOUNTER — Telehealth: Payer: Self-pay | Admitting: *Deleted

## 2016-08-06 ENCOUNTER — Other Ambulatory Visit: Payer: Self-pay | Admitting: Certified Nurse Midwife

## 2016-08-06 DIAGNOSIS — O894 Spinal and epidural anesthesia-induced headache during the puerperium: Secondary | ICD-10-CM | POA: Insufficient documentation

## 2016-08-06 NOTE — Telephone Encounter (Signed)
Patient called stating she is still having headaches caused by her epidural. Patient delivered 4/30. Please Advise

## 2016-08-06 NOTE — Telephone Encounter (Signed)
Called Brooke Fuller, verified full name and date of birth.   Jill Sidelison reports spinal headache exactly the same as the one in the hospital that was treated with blood patch. Brooke Fuller states she is fine when lying down, but upon standing immediately has a "blinding headache". Says everything else about being at home is great.   Denies difficulty breathing or respiratory distress, chest pain, blurred vision, epigastric or abdominal pain, excessive vaginal bleeding, and leg pain or swelling.   Advised Brooke Fuller that our referral specialist would get in [facility], and they will contact her with plan of care.   Reviewed red flag symptoms and when to call.   Brooke Fuller information given to Ricky StabsKristal Cox, referral specialist.    Gunnar BullaJenkins Michelle Jaizon Deroos, CNM

## 2016-08-12 ENCOUNTER — Telehealth: Payer: Self-pay | Admitting: Certified Nurse Midwife

## 2016-08-12 ENCOUNTER — Encounter: Payer: Self-pay | Admitting: Certified Nurse Midwife

## 2016-08-12 NOTE — Telephone Encounter (Signed)
HIPPA approved voicemail left on identified line checking on patient status.   Call back with further needs, questions, or concerns,    Serafina RoyalsMichelle Lawhorn, CNM

## 2016-08-25 ENCOUNTER — Observation Stay
Admit: 2016-08-25 | Discharge: 2016-08-25 | DRG: 776 | Disposition: A | Discharge status: Home / Self Care | Payer: BLUE CROSS/BLUE SHIELD | Source: Ambulatory Visit | Attending: Obstetrics and Gynecology | Admitting: Obstetrics and Gynecology

## 2016-08-25 ENCOUNTER — Encounter: Payer: Self-pay | Admitting: Emergency Medicine

## 2016-08-25 ENCOUNTER — Inpatient Hospital Stay: Payer: BLUE CROSS/BLUE SHIELD

## 2016-08-25 ENCOUNTER — Emergency Department
Admission: EM | Admit: 2016-08-25 | Discharge: 2016-08-25 | Disposition: A | Discharge status: Home / Self Care | Payer: BLUE CROSS/BLUE SHIELD | Source: Home / Self Care | Attending: Emergency Medicine | Admitting: Emergency Medicine

## 2016-08-25 ENCOUNTER — Inpatient Hospital Stay (EMERGENCY_DEPARTMENT_HOSPITAL): Payer: BLUE CROSS/BLUE SHIELD

## 2016-08-25 ENCOUNTER — Encounter: Payer: Self-pay | Admitting: *Deleted

## 2016-08-25 DIAGNOSIS — O99355 Diseases of the nervous system complicating the puerperium: Secondary | ICD-10-CM | POA: Diagnosis not present

## 2016-08-25 DIAGNOSIS — Z79899 Other long term (current) drug therapy: Secondary | ICD-10-CM | POA: Diagnosis not present

## 2016-08-25 DIAGNOSIS — O9953 Diseases of the respiratory system complicating the puerperium: Secondary | ICD-10-CM | POA: Insufficient documentation

## 2016-08-25 DIAGNOSIS — J45909 Unspecified asthma, uncomplicated: Secondary | ICD-10-CM

## 2016-08-25 DIAGNOSIS — R569 Unspecified convulsions: Secondary | ICD-10-CM

## 2016-08-25 DIAGNOSIS — G40909 Epilepsy, unspecified, not intractable, without status epilepticus: Secondary | ICD-10-CM | POA: Diagnosis not present

## 2016-08-25 DIAGNOSIS — O9989 Other specified diseases and conditions complicating pregnancy, childbirth and the puerperium: Secondary | ICD-10-CM

## 2016-08-25 DIAGNOSIS — O151 Eclampsia in labor: Secondary | ICD-10-CM | POA: Diagnosis present

## 2016-08-25 DIAGNOSIS — O152 Eclampsia in the puerperium: Secondary | ICD-10-CM | POA: Diagnosis present

## 2016-08-25 DIAGNOSIS — Z87898 Personal history of other specified conditions: Secondary | ICD-10-CM

## 2016-08-25 DIAGNOSIS — O159 Eclampsia, unspecified as to time period: Secondary | ICD-10-CM | POA: Diagnosis present

## 2016-08-25 LAB — URINALYSIS, COMPLETE (UACMP) WITH MICROSCOPIC
BACTERIA UA: NONE SEEN
Bilirubin Urine: NEGATIVE
Glucose, UA: NEGATIVE mg/dL
Ketones, ur: NEGATIVE mg/dL
NITRITE: NEGATIVE
PROTEIN: NEGATIVE mg/dL
SPECIFIC GRAVITY, URINE: 1.014 (ref 1.005–1.030)
pH: 6 (ref 5.0–8.0)

## 2016-08-25 LAB — COMPREHENSIVE METABOLIC PANEL
ALK PHOS: 58 U/L (ref 38–126)
ALT: 25 U/L (ref 14–54)
AST: 23 U/L (ref 15–41)
Albumin: 3.6 g/dL (ref 3.5–5.0)
Anion gap: 7 (ref 5–15)
BUN: 15 mg/dL (ref 6–20)
CALCIUM: 8.8 mg/dL — AB (ref 8.9–10.3)
CO2: 24 mmol/L (ref 22–32)
CREATININE: 0.54 mg/dL (ref 0.44–1.00)
Chloride: 108 mmol/L (ref 101–111)
GFR calc non Af Amer: >60 mL/min (ref >60)
Glucose, Bld: 122 mg/dL — ABNORMAL HIGH (ref 65–99)
Potassium: 3.6 mmol/L (ref 3.5–5.1)
SODIUM: 139 mmol/L (ref 135–145)
Total Bilirubin: 0.6 mg/dL (ref 0.3–1.2)
Total Protein: 6.7 g/dL (ref 6.5–8.1)

## 2016-08-25 LAB — DIFFERENTIAL
BAND NEUTROPHILS: 0 %
BLASTS: 0 %
Basophils Absolute: 0.1 10*3/uL (ref 0–0.1)
Basophils Relative: 1 %
EOS PCT: 5 %
Eosinophils Absolute: 0.3 10*3/uL (ref 0–0.7)
LYMPHS PCT: 45 %
Lymphs Abs: 2.9 10*3/uL (ref 1.0–3.6)
METAMYELOCYTES PCT: 0 %
MONOS PCT: 9 %
Monocytes Absolute: 0.6 10*3/uL (ref 0.2–0.9)
Myelocytes: 0 %
NEUTROS ABS: 2.6 10*3/uL (ref 1.4–6.5)
Neutrophils Relative %: 40 %
OTHER: 0 %
Promyelocytes Absolute: 0 %
nRBC: 113 /100 WBC — ABNORMAL HIGH

## 2016-08-25 LAB — PROTEIN / CREATININE RATIO, URINE: CREATININE, URINE: 86 mg/dL

## 2016-08-25 LAB — URINE DRUG SCREEN, QUALITATIVE (ARMC ONLY)
Amphetamines, Ur Screen: NOT DETECTED
BARBITURATES, UR SCREEN: NOT DETECTED
BENZODIAZEPINE, UR SCRN: NOT DETECTED
COCAINE METABOLITE, UR ~~LOC~~: NOT DETECTED
Cannabinoid 50 Ng, Ur ~~LOC~~: NOT DETECTED
MDMA (Ecstasy)Ur Screen: NOT DETECTED
METHADONE SCREEN, URINE: NOT DETECTED
OPIATE, UR SCREEN: NOT DETECTED
PHENCYCLIDINE (PCP) UR S: NOT DETECTED
Tricyclic, Ur Screen: NOT DETECTED

## 2016-08-25 LAB — CBC
HCT: 38.4 % (ref 35.0–47.0)
HEMOGLOBIN: 13.1 g/dL (ref 12.0–16.0)
MCH: 30.1 pg (ref 26.0–34.0)
MCHC: 34.1 g/dL (ref 32.0–36.0)
MCV: 88.1 fL (ref 80.0–100.0)
Platelets: 195 10*3/uL (ref 150–440)
RBC: 4.36 MIL/uL (ref 3.80–5.20)
RDW: 13.1 % (ref 11.5–14.5)
WBC: 6.5 10*3/uL (ref 3.6–11.0)

## 2016-08-25 MED ORDER — CALCIUM GLUCONATE 10 % IV SOLN
INTRAVENOUS | Status: AC
  Filled 2016-08-25: qty 10

## 2016-08-25 MED ORDER — LABETALOL HCL 5 MG/ML IV SOLN: 20.0000 mg | INTRAVENOUS | Status: DC | PRN

## 2016-08-25 MED ORDER — LEVETIRACETAM 500 MG/5ML IV SOLN
1000.0000 mg | Freq: Once | INTRAVENOUS | Status: AC
  Administered 2016-08-25: 1000 mg via INTRAVENOUS
  Filled 2016-08-25: qty 10

## 2016-08-25 MED ORDER — MAGNESIUM SULFATE 40 G IN LACTATED RINGERS - SIMPLE: 2.0000 g/h | INTRAVENOUS | Status: DC

## 2016-08-25 MED ORDER — LACTATED RINGERS IV SOLN
INTRAVENOUS | Status: DC
  Administered 2016-08-25: 09:00:00 via INTRAVENOUS

## 2016-08-25 MED ORDER — MAGNESIUM SULFATE BOLUS VIA INFUSION
4.0000 g | Freq: Once | INTRAVENOUS | Status: AC
  Administered 2016-08-25: 4 g via INTRAVENOUS
  Filled 2016-08-25: qty 500

## 2016-08-25 MED ORDER — ACETAMINOPHEN 325 MG PO TABS: 650.0000 mg | ORAL_TABLET | ORAL | Status: DC | PRN

## 2016-08-25 MED ORDER — HYDRALAZINE HCL 20 MG/ML IJ SOLN: 10.0000 mg | Freq: Once | INTRAMUSCULAR | Status: DC | PRN

## 2016-08-25 MED ORDER — MAGNESIUM SULFATE 50 % IJ SOLN
2.0000 g/h | INTRAVENOUS | Status: DC
  Filled 2016-08-25: qty 80

## 2016-08-25 MED ORDER — MAGNESIUM SULFATE BOLUS VIA INFUSION: 4.0000 g | Freq: Once | INTRAVENOUS | Status: DC

## 2016-08-25 MED ORDER — SODIUM CHLORIDE FLUSH 0.9 % IV SOLN
INTRAVENOUS | Status: AC
  Filled 2016-08-25: qty 10

## 2016-08-25 MED ORDER — HYDRALAZINE HCL 20 MG/ML IJ SOLN: 5.0000 mg | INTRAMUSCULAR | Status: DC | PRN

## 2016-08-25 MED ORDER — LEVETIRACETAM 500 MG PO TABS: 500.0000 mg | ORAL_TABLET | Freq: Two times a day (BID) | ORAL | 1 refills | Status: AC

## 2016-08-25 NOTE — ED Triage Notes (Signed)
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

## 2016-08-25 NOTE — Discharge Summary (Signed)
Physician Discharge Summary  Patient ID: Brooke Fuller MRN: [id number] DOB/AGE: 1985-07-09 31 y.o.  Admit date: 08/25/2016 Discharge date: 08/25/2016  Admission Diagnoses: S/P Vaginal Delivery - 3 weeks ago Post Partum Seizure H/O Seizure Disorder  Discharge Diagnoses:  Active Problems:   Seizure [facility](HCC)   Discharged Condition: good  Hospital Course: uncomplicated  Consults: neurology  Significant Diagnostic Studies: labs:  CBC    Component Value Date/Time   WBC 6.5 08/25/2016 0446   RBC 4.36 08/25/2016 0446   HGB 13.1 08/25/2016 0446   HCT 38.4 08/25/2016 0446   HCT 36.2 04/30/2016 1509   PLT 195 08/25/2016 0446   PLT 223 12/25/2015 1029   MCV 88.1 08/25/2016 0446   MCV 84 12/25/2015 1029   MCH 30.1 08/25/2016 0446   MCHC 34.1 08/25/2016 0446   RDW 13.1 08/25/2016 0446   RDW 14.4 12/25/2015 1029   LYMPHSABS 2.9 08/25/2016 0446   LYMPHSABS 1.5 12/25/2015 1029   MONOABS 0.6 08/25/2016 0446   EOSABS 0.3 08/25/2016 0446   EOSABS 0.1 12/25/2015 1029   BASOSABS 0.1 08/25/2016 0446   BASOSABS 0.0 12/25/2015 1029   CMP Latest Ref Rng & Units 08/25/2016 08/03/2016 08/01/2016  Glucose 65 - 99 mg/dL 440(N122(H) 83 99  BUN 6 - 20 mg/dL 15 12 -  Creatinine 0.270.44 - 1.00 mg/dL 2.530.54 6.640.47 -  Sodium 403135 - 145 mmol/L 139 139 -  Potassium 3.5 - 5.1 mmol/L 3.6 3.9 -  Chloride 101 - 111 mmol/L 108 111 -  CO2 22 - 32 mmol/L 24 23 -  Calcium 8.9 - 10.3 mg/dL 4.7(Q8.8(L) 2.5(Z8.4(L) -  Total Protein 6.5 - 8.1 g/dL 6.7 5.2(L) -  Total Bilirubin 0.3 - 1.2 mg/dL 0.6 0.5 -  Alkaline Phos 38 - 126 U/L 58 70 -  AST 15 - 41 U/L 23 23 -  ALT 14 - 54 U/L 25 10(L) -  Urinalysis - Negative  UDS - Negative , radiology: MRI: normal Brain; and EKG - normal; and  EEG - ABNORMAL   Treatments: IV hydration, IV Magnesium Sulfate; Keppra 1 gram IV load, then 500 mg po twice daily Discharge Exam: Blood pressure 123/69, pulse 84, temperature 98.3 F (36.8 C), temperature source Oral, resp. rate 16, height 5\' 4"   (1.626 m), weight 260 lb (117.9 kg), SpO2 96 %, unknown if currently breastfeeding. General appearance: alert, cooperative and no distress  Disposition: Discharge home; [facility] 2 weeks [facility]  Discharge Instructions    Discharge patient    Complete by:  As directed    Discharge disposition:  01-Home or Self Care   Discharge patient date:  08/25/2016     Allergies as of 08/25/2016      Reactions   Red Dye Other (See Comments)   Severe nausea and vomiting      Medication List    TAKE these medications   ibuprofen 600 MG tablet Commonly known as:  ADVIL,MOTRIN Take 1 tablet (600 mg total) by mouth every 6 (six) hours.   levETIRAcetam 500 MG tablet Commonly known as:  KEPPRA Take 1 tablet (500 mg total) by mouth 2 (two) times daily. First dose in AM 08/26/2016   prenatal multivitamin Tabs tablet Take 1 tablet by mouth daily at 12 noon.   simethicone 80 MG chewable tablet Commonly known as:  MYLICON Chew 1 tablet (80 mg total) by mouth as needed for flatulence.      Follow-up Information    Morene CrockerPotter, Zachary E, MD. Schedule an  appointment as soon as possible for a visit in 2 week(s).   Specialty:  Neurology Why:  Follow Up after starting KEPPRA for seizure Disorder [facility] Evaluation by Dr. Thad Ranger) Contact information: [phone] [facility] [address] [facility] [place] [address] [phone]           Signed: Prentice Docker Kamoria Lucien 08/25/2016, 5:58 PM

## 2016-08-25 NOTE — OB Triage Note (Signed)
Patient and husband given discharge instructions including seizure precautions, husband to drive home, and follow up information, verbalized understanding. Patient discharged in stable condition, accompanied by husband.

## 2016-08-25 NOTE — ED Provider Notes (Addendum)
[facility] Emergency Department Provider Note  Time seen: 4:45 AM  I have reviewed the triage vital signs and the nursing notes.   HISTORY  Chief Complaint Seizures    HPI Brooke Fuller is a 31 y.o. female with a past medical history of gestational diabetes, PE COS, seizure disorder, presents to the emergency department after a tonic-clonic seizure. According to the patient was sleeping she had a tonic-clonic seizure, witnessed by her husband. Patient states upon awakening she was confused and is now very tired which she states is normal after a seizure for her. Patient states she used to take Dilantin for her seizure disorder but has not had a seizure in over 8 years. Patient is 3 weeks postpartum status post normal spontaneous vaginal delivery. Currently the patient's only complaint is of generalized fatigue. Denies any pain. Denies incontinence or tongue abrasions. Patient appears well, normal blood pressure, no distress.  Past Medical History:  Diagnosis Date  . Gestational diabetes   . PCOS (polycystic ovarian syndrome)   . PCOS (polycystic ovarian syndrome)   . Seizures [facility])     Patient Active Problem List   Diagnosis Date Noted  . Postpartum spinal headache 08/06/2016  . Gestational diabetes 07/12/2016  . Morbid obesity with body mass index (BMI) of 45.0 to 49.9 in adult [facility]) 12/30/2015  . Rubella non-immune status, antepartum 12/30/2015  . Asthma, history 12/16/2015  . History of seizures 12/16/2015    Past Surgical History:  Procedure Laterality Date  . NO PAST SURGERIES      Prior to Admission medications   Medication Sig Start Date End Date Taking? Authorizing Provider  ibuprofen (ADVIL,MOTRIN) 600 MG tablet Take 1 tablet (600 mg total) by mouth every 6 (six) hours. 08/04/16   [name], CNM  Prenatal Vit-Fe Fumarate-FA (PRENATAL MULTIVITAMIN) TABS tablet Take 1 tablet by mouth daily at 12 noon.    [provider]   simethicone (MYLICON) 80 MG chewable tablet Chew 1 tablet (80 mg total) by mouth as needed for flatulence. 08/04/16   [name], CNM    Allergies  Allergen Reactions  . Red Dye Other (See Comments)    Severe nausea and vomiting    Family History  Problem Relation Age of Onset  . Alzheimer's disease Mother   . Seizures Mother   . Cancer Maternal Grandmother   . Heart failure Maternal Grandfather   . Hypertension Maternal Grandfather   . Stroke Maternal Grandfather     Social History Social History  Substance Use Topics  . Smoking status: Never Smoker  . Smokeless tobacco: Never Used  . Alcohol use No     Comment: None since pregnancy    Review of Systems Constitutional: Negative for fever. Positive for generalized fatigue. Cardiovascular: Negative for chest pain. Respiratory: Negative for shortness of breath. Gastrointestinal: Negative for abdominal pain Genitourinary: Mild spotting. Musculoskeletal: Negative for back pain. Skin: Negative for rash. Neurological: Negative for headache All other ROS negative  ____________________________________________   PHYSICAL EXAM:  VITAL SIGNS: ED Triage Vitals  Enc Vitals Group     BP 08/25/16 0420 127/85     Pulse Rate 08/25/16 0420 (!) 106     Resp 08/25/16 0420 20     Temp 08/25/16 0420 98 F (36.7 C)     Temp Source 08/25/16 0420 Oral     SpO2 08/25/16 0420 99 %     Weight 08/25/16 0419 260 lb (117.9 kg)     Height 08/25/16 0419  5\' 4"  (1.626 m)     Head Circumference --      Peak Flow --      Pain Score 08/25/16 0419 1     Pain Loc --      Pain Edu? --      Excl. in GC? --     Constitutional: Alert and oriented. Well appearing and in no distress. Eyes: Normal exam ENT   Head: Normocephalic and atraumatic.   Mouth/Throat: Mucous membranes are moist.No oral trauma. Cardiovascular: Normal rate, regular rhythm. No murmur Respiratory: Normal respiratory effort without tachypnea nor  retractions. Breath sounds are clear  Gastrointestinal: Soft and nontender. No distention.  Musculoskeletal: Nontender with normal range of motion in all extremities. No lower extremity edema. Neurologic:  Normal speech and language. No gross focal neurologic deficits  Skin:  Skin is warm, dry and intact.  Psychiatric: Mood and affect are normal.   ____________________________________________    INITIAL IMPRESSION / ASSESSMENT AND PLAN / ED COURSE  Pertinent labs & imaging results that were available during my care of the patient were reviewed by me and considered in my medical decision making (see chart for details).  The patient presents to the emergency department after a seizure at home. Patient is 3 weeks postpartum, but also has a history of a seizure disorder but is not had a seizure in over 8 years per patient. Patient denies any preeclampsia history, blood pressure is normal. Physical exam is normal besides patient stating that she feels fatigued. We will check labs including urinalysis. We will continue to closely monitor in the emergency department. Patient states she is formula as well as breast feeding currently.  Patient's blood work is largely within normal limits, urinalysis is pending. I discussed the patient with OB/GYN. They state given the long lapse of prior seizures until today they're concerned of possible eclampsia. They would like to admit the patient to their service for magnesium treatment and have the patient seen by neurology. They are requesting that we technically discharge her from the emergency department so she can be admitted to labor and delivery. We will discharge the patient, and a nurse will bring her in a wheelchair to labor and delivery for admission. Patient is agreeable to this plan.   EKG reviewed and interpreted by myself shows sinus tachycardia 107 bpm, narrow QRS, normal axis, normal intervals, no concerning ST  changes.  ____________________________________________   FINAL CLINICAL IMPRESSION(S) / ED DIAGNOSES  Seizure    Minna AntisPaduchowski, Horton [place]pe, MD 08/25/16 [id number]    Minna AntisPaduchowski, Thuy Atilano, MD 08/25/16 [phone]

## 2016-08-25 NOTE — Progress Notes (Signed)
Off floor for EEG

## 2016-08-25 NOTE — H&P (Signed)
Brooke Fuller is a 31 y.o. G1P1 3 week post partum  Admitted through the ER for tonic clonic seizure with post ictal course earlier today.  S/P SVD. Prior remote history of seizure disorder years ago, not on any recent meds. Post partum course notable for post epidural spinal headache after delivery treated with blood patch. HA resolved approximately 1 week ago. Review of prenatal OB record revealed no hypertension BP (all less than 134/84) readings or proteinuria prior to delivery.. Currently breast feeding.  Last seizure was in 2011; thought to be related to extreme fatigue; previously treated with Dilantin.  Length of Stay:  0 Days. Admitted 08/25/2016  Subjective: Patient admitted through ER. Work up included normal CBC, CMP, BP; Urine negative protein. Currently complaining of fatigue only.  Past Medical History:  Diagnosis Date  . Gestational diabetes   . PCOS (polycystic ovarian syndrome)   . PCOS (polycystic ovarian syndrome)   . Seizures ([place])    Past Surgical History:  Procedure Laterality Date  . NO PAST SURGERIES              Prior to Admission medications   Medication Sig Start Date End Date Taking? Authorizing Provider  ibuprofen (ADVIL,MOTRIN) 600 MG tablet Take 1 tablet (600 mg total) by mouth every 6 (six) hours. 08/04/16   Diona Fanti, CNM  Prenatal Vit-Fe Fumarate-FA (PRENATAL MULTIVITAMIN) TABS tablet Take 1 tablet by mouth daily at 12 noon.    [provider]  simethicone (MYLICON) 80 MG chewable tablet Chew 1 tablet (80 mg total) by mouth as needed for flatulence. 08/04/16   Diona Fanti, CNM         Allergies  Allergen Reactions  . Red Dye Other (See Comments)    Severe nausea and vomiting         Family History  Problem Relation Age of Onset  . Alzheimer's disease Mother   . Seizures Mother   . Cancer Maternal Grandmother   . Heart failure Maternal Grandfather   . Hypertension Maternal Grandfather    . Stroke Maternal Grandfather     Social History       Social History  Substance Use Topics  . Smoking status: Never Smoker  . Smokeless tobacco: Never Used  . Alcohol use No     Comment: None since pregnancy       Vitals:  Blood pressure 115/69, pulse 95, temperature 98 F (36.7 C), temperature source Oral, resp. rate 16, height 5' 4"  (1.626 m), weight 260 lb (117.9 kg), SpO2 95 %, unknown if currently breastfeeding. Physical Examination: CONSTITUTIONAL: Well-developed, well-nourished female in no acute distress.  SKIN: Skin is warm and dry. No rash noted. Not diaphoretic. No erythema. No pallor. Ethel: Alert and oriented to person, place, and time. Normal reflexes (1+), muscle tone coordination. No cranial nerve deficit noted. PSYCHIATRIC: Normal mood and affect. Normal behavior. Normal judgment and thought content. CARDIOVASCULAR: Normal heart rate noted, regular rhythm RESPIRATORY: Effort and breath sounds normal, no problems with respiration noted MUSCULOSKELETAL: Normal range of motion. No edema and no tenderness. 2+ distal pulses. ABDOMEN: Soft, nontender, nondistended, gravid. Extremeties: Trace edema, warm, dry  Results for orders placed or performed during the hospital encounter of 08/25/16 (from the past 48 hour(s))  CBC     Status: None   Collection Time: 08/25/16  4:46 AM  Result Value Ref Range   WBC 6.5 3.6 - 11.0 K/uL   RBC 4.36 3.80 - 5.20 MIL/uL   Hemoglobin 13.1 12.0 -  16.0 g/dL   HCT 38.4 35.0 - 47.0 %   MCV 88.1 80.0 - 100.0 fL   MCH 30.1 26.0 - 34.0 pg   MCHC 34.1 32.0 - 36.0 g/dL   RDW 13.1 11.5 - 14.5 %   Platelets 195 150 - 440 K/uL  Comprehensive metabolic panel     Status: Abnormal   Collection Time: 08/25/16  4:46 AM  Result Value Ref Range   Sodium 139 135 - 145 mmol/L   Potassium 3.6 3.5 - 5.1 mmol/L   Chloride 108 101 - 111 mmol/L   CO2 24 22 - 32 mmol/L   Glucose, Bld 122 (H) 65 - 99 mg/dL   BUN 15 6 - 20 mg/dL    Creatinine, Ser 0.54 0.44 - 1.00 mg/dL   Calcium 8.8 (L) 8.9 - 10.3 mg/dL   Total Protein 6.7 6.5 - 8.1 g/dL   Albumin 3.6 3.5 - 5.0 g/dL   AST 23 15 - 41 U/L   ALT 25 14 - 54 U/L   Alkaline Phosphatase 58 38 - 126 U/L   Total Bilirubin 0.6 0.3 - 1.2 mg/dL   GFR calc non Af Amer >60 >60 mL/min   GFR calc Af Amer >60 >60 mL/min    Comment: (NOTE) The eGFR has been calculated using the CKD EPI equation. This calculation has not been validated in all clinical situations. eGFR's persistently <60 mL/min signify possible Chronic Kidney Disease.    Anion gap 7 5 - 15    No results found.  Current scheduled medications . magnesium  4 g Intravenous Once   ASSESSMENT: Post Partum Seizure, can not rule out Eclamptic seizure vrs recurrence of remote history of Generalized Seizure Disorder.  Patient Active Problem List   Diagnosis Date Noted  . Postpartum spinal headache 08/06/2016  . Gestational diabetes 07/12/2016  . Morbid obesity with body mass index (BMI) of 45.0 to 49.9 in adult [facility]) 12/30/2015  . Rubella non-immune status, antepartum 12/30/2015  . Asthma, history 12/16/2015  . History of seizures 12/16/2015    PLAN: 1, Admit to Labor and Delivery 2. Load with Magnesium Sulfate (4 grams over 20 minutes) and maintain infusion (2 grams/hour) x 24 hours 3. Hourly U.O and DTR check. 3. Neurology Consultation 4. Check urine drug screen 5. Repeat CBC, Hepatic Profile, BMP, Urine P:C ratio in 24 hours. 6. Consider head CT  Brayton Mars, MD

## 2016-08-25 NOTE — Progress Notes (Signed)
Ms. Brooke Fuller here post seizure at 0100 this AM, reports seizure lasted approx 2 min per pt husband. Last seizure 2011, no meds, dx with epilepsy as a child. Pt reports chest tightness and dizziness as reasons for coming to hospital. Denies headache, visual changes, epigastric pain. Also reports shoulder pain that has been occurring over past 3 weeks, pt reports that she thinks it is from holding newborn.

## 2016-08-25 NOTE — Discharge Instructions (Signed)
Please proceed directly to labor and delivery for admission to the hospital, for further workup and treatment.

## 2016-08-25 NOTE — ED Notes (Addendum)
Pt was asked if she needed to urinate and patient states she had peed prior to getting here. And can't go right now

## 2016-08-25 NOTE — Discharge Instructions (Signed)
Epilepsy Epilepsy is a condition in which a person has repeated seizures over time. A seizure is a sudden burst of abnormal electrical and chemical activity in the brain. Seizures can cause a change in attention, behavior, or the ability to remain awake and alert (altered mental status). Epilepsy increases a person's risk of falls, accidents, and injury. It can also lead to complications, including:  Depression.  Poor memory.  Sudden unexplained death in epilepsy (SUDEP). This complication is rare, and its cause is not known. Most people with epilepsy lead normal lives. What are the causes? This condition may be caused by:  A head injury.  An injury that happens at birth.  A high fever during childhood.  A stroke.  Bleeding that goes into or around the brain.  Certain medicines and drugs.  Having too little oxygen for a long period of time.  Abnormal brain development.  Certain infections, such as meningitis and encephalitis.  Brain tumors.  Conditions that are passed along from parent to child (are hereditary). What are the signs or symptoms? Symptoms of a seizure vary greatly from person to person. They include:  Convulsions.  Stiffening of the body.  Involuntary movements of the arms or legs.  Loss of consciousness.  Breathing problems.  Falling suddenly.  Confusion.  Head nodding.  Eye blinking or fluttering.  Lip smacking.  Drooling.  Rapid eye movements.  Grunting.  Loss of bladder control and bowel control.  Staring.  Unresponsiveness. Some people have symptoms right before a seizure happens (aura) and right after a seizure happens. Symptoms of an aura include:  Fear or anxiety.  Nausea.  Feeling like the room is spinning (vertigo).  A feeling of having seen or heard something before (deja vu).  Odd tastes or smells.  Changes in vision, such as seeing flashing lights or spots. Symptoms that follow a seizure  include:  Confusion.  Sleepiness.  Headache. How is this diagnosed? This condition is diagnosed based on:  Your symptoms.  Your medical history.  A physical exam.  A neurological exam. A neurological exam is similar to a physical exam. It involves checking your strength, reflexes, coordination, and sensations.  Tests, such as:  An electroencephalogram (EEG). This is a painless test that creates a diagram of your brain waves.  An MRI of the brain.  A CT scan of the brain.  A lumbar puncture, also called a spinal tap.  Blood tests to check for signs of infection or abnormal blood chemistry. How is this treated? There is no cure for this condition, but treatment can help control seizures. Treatment may involve:  Taking medicines to control seizures. These include medicines to prevent seizures and medicines to stop seizures as they occur.  Having a device called a vagus nerve stimulator implanted in the chest. The device sends electrical impulses to the vagus nerve and to the brain to prevent seizures. This treatment may be recommended if medicines do not help.  Brain surgery. There are several kinds of surgeries that may be done to stop seizures from happening or to reduce how often seizures happen.  Having regular blood tests. You may need to have blood tests regularly to check that you are getting the right amount of medicine. Once this condition has been diagnosed, it is important to begin treatment as soon as possible. For some people, epilepsy eventually goes away. Follow these instructions at home: Medicines    Take over-the-counter and prescription medicines only as told by your health care  provider.  Avoid any substances that may prevent your medicine from working properly, such as alcohol. Activity   Get enough rest. Lack of sleep can make seizures more likely to occur.  Follow instructions from your health care provider about driving, swimming, and doing any  other activities that would be dangerous if you had a seizure. Educating others  Teach friends and family what to do if you have a seizure. They should:  Lay you on the ground to prevent a fall.  Cushion your head and body.  Loosen any tight clothing around your neck.  Turn you on your side. If vomiting occurs, this helps keep your airway clear.  Stay with you until you recover.  Not hold you down. Holding you down will not stop the seizure.  Not put anything in your mouth.  Know whether or not you need emergency care. General instructions   Avoid anything that has ever triggered a seizure for you.  Keep a seizure diary. Record what you remember about each seizure, especially anything that might have triggered the seizure.  Keep all follow-up visits as told by your health care provider. This is important. Contact a health care provider if:  Your seizure pattern changes.  You have symptoms of infection or another illness. This might increase your risk of having a seizure. Get help right away if:  You have a seizure that does not stop after 5 minutes.  You have several seizures in a row without a complete recovery in between seizures.  You have a seizure that makes it harder to breathe.  You have a seizure that is different from previous seizures.  You have a seizure that leaves you unable to speak or use a part of your body.  You did not wake up immediately after a seizure. This information is not intended to replace advice given to you by your health care provider. Make sure you discuss any questions you have with your health care provider. Document Released: 03/22/2005 Document Revised: 10/18/2015 Document Reviewed: 09/30/2015 [employer] Interactive Patient Education  2017 [employer] Levetiracetam extended-release tablets What is this medicine? LEVETIRACETAM (lee ve tye RA se tam) is an antiepileptic drug. It is used with other medicines to treat certain types of  seizures. This medicine may be used for other purposes; ask your health care provider or pharmacist if you have questions. COMMON BRAND NAME(S): Keppra XR What should I tell my health care provider before I take this medicine? They need to know if you have any of these conditions: -kidney disease -suicidal thoughts, plans, or attempt; a previous suicide attempt by you or a family member -an unusual or allergic reaction to levetiracetam, other medicines, foods, dyes, or preservatives -pregnant or trying to get pregnant -breast-feeding How should I use this medicine? Take this medicine by mouth with a glass of water. Follow the directions on the prescription label. Do not cut, crush or chew this medicine. You may take this medicine with or without food. Take your doses at regular intervals. Do not take your medicine more often than directed. Do not stop taking this medicine or any of your seizure medicines unless instructed by your doctor or health care professional. Stopping your medicine suddenly can increase your seizures or their severity. A special MedGuide will be given to you by the pharmacist with each prescription and refill. Be sure to read this information carefully each time. Contact your pediatrician or health care professional regarding the use of this medication in children. While  this drug may be prescribed for children as young as 48 years of age for selected conditions, precautions do apply. Overdosage: If you think you have taken too much of this medicine contact a poison control center or emergency room at once. NOTE: This medicine is only for you. Do not share this medicine with others. What if I miss a dose? If you miss a dose and it has only been a few hours, take it as soon as you can. If it is almost time for your next dose, take only that dose. Do not take double or extra doses. What may interact with this medicine? This medicine may interact with the following  medications: -carbamazepine -colesevelam -probenecid -sevelamer This list may not describe all possible interactions. Give your health care provider a list of all the medicines, herbs, non-prescription drugs, or dietary supplements you use. Also tell them if you smoke, drink alcohol, or use illegal drugs. Some items may interact with your medicine. What should I watch for while using this medicine? Visit your doctor or health care professional for a regular check on your progress. Wear a medical identification bracelet or chain to say you have epilepsy, and carry a card that lists all your medications. It is important to take this medicine exactly as instructed by your health care professional. When first starting treatment, your dose may need to be adjusted. It may take weeks or months before your dose is stable. You should contact your doctor or health care professional if your seizures get worse or if you have any new types of seizures. You may get drowsy or dizzy. Do not drive, use machinery, or do anything that needs mental alertness until you know how this medicine affects you. Do not stand or sit up quickly, especially if you are an older patient. This reduces the risk of dizzy or fainting spells. Alcohol may interfere with the effect of this medicine. Avoid alcoholic drinks. The use of this medicine may increase the chance of suicidal thoughts or actions. Pay special attention to how you are responding while on this medicine. Any worsening of mood, or thoughts of suicide or dying should be reported to your health care professional right away. The tablet shell for some brands of this medicine does not dissolve. This is normal. The tablet shell may appear in the stool. This is not cause for concern. Women who become pregnant while using this medicine may enroll in the [place] [employer] Antiepileptic Drug Pregnancy Registry by calling [phone]. This registry collects information about the safety  of antiepileptic drug use during pregnancy. What side effects may I notice from receiving this medicine? Side effects that you should report to your doctor or health care professional as soon as possible: -allergic reactions like skin rash, itching or hives, swelling of the face, lips, or tongue -breathing problems -changes in emotions or moods -dark urine -general ill feeling or flu-like symptoms -problems with balance, talking, walking -suicidal thoughts or actions -unusually weak or tired -yellowing of the eyes or skin Side effects that usually do not require medical attention (report to your doctor or health care professional if they continue or are bothersome): -diarrhea -dizzy, drowsy -headache -loss of appetite This list may not describe all possible side effects. Call your doctor for medical advice about side effects. You may report side effects to FDA at 1-800-FDA-1088. Where should I keep my medicine? Keep out of reach of children. Store at room temperature between 15 and 30 degrees C (59 and 86  degrees F). Throw away any unused medicine after the expiration date. NOTE: This sheet is a summary. It may not cover all possible information. If you have questions about this medicine, talk to your doctor, pharmacist, or health care provider.  2018 Elsevier/Gold Standard (2015-04-24 09:48:52)

## 2016-08-25 NOTE — Consult Note (Signed)
Reason for Consult:Seizure Referring Physician: Defrancesco  CC: Seizure  HPI: Brooke Fuller is an 31 y.o. female three weeks postpartum.  Postpartum course complicated by headache that was felt to be a spinal headache. Patient received a blood patch, yet it took another week before her headaches resolved.  Patient has been doing well for the past week but last night in her sleep was noted to have a GTC seizure.  Patient reports that she has a history of seizures.  Described as GTC and starting when she was about 14.  Was placed on Dilantin with good control  Medication stopped when she got married and patient has done well off medications.  Reports that all of her seizures have been during her sleep.  Does have some jerking that she notes on going to sleep, particularly recently since she has been so tired.    Past Medical History:  Diagnosis Date  . Gestational diabetes   . PCOS (polycystic ovarian syndrome)   . PCOS (polycystic ovarian syndrome)   . Seizures (HCC)     Past Surgical History:  Procedure Laterality Date  . NO PAST SURGERIES      Family History  Problem Relation Age of Onset  . Alzheimer's disease Mother   . Seizures Mother   . Cancer Maternal Grandmother   . Heart failure Maternal Grandfather   . Hypertension Maternal Grandfather   . Stroke Maternal Grandfather     Social History:  reports that she has never smoked. She has never used smokeless tobacco. She reports that she does not drink alcohol or use drugs.  Allergies  Allergen Reactions  . Red Dye Other (See Comments)    Severe nausea and vomiting    Medications:  I have reviewed the patient's current medications. Prior to Admission:  Prescriptions Prior to Admission  Medication Sig Dispense Refill Last Dose  . ibuprofen (ADVIL,MOTRIN) 600 MG tablet Take 1 tablet (600 mg total) by mouth every 6 (six) hours. 30 tablet 0   . Prenatal Vit-Fe Fumarate-FA (PRENATAL MULTIVITAMIN) TABS tablet Take 1 tablet  by mouth daily at 12 noon.   07/31/2016 at Unknown time  . simethicone (MYLICON) 80 MG chewable tablet Chew 1 tablet (80 mg total) by mouth as needed for flatulence. 30 tablet 0    Scheduled: . calcium gluconate      . sodium chloride flush        ROS: History obtained from the patient  General ROS: negative for - chills, fatigue, fever, night sweats, weight gain or weight loss Psychological ROS: negative for - behavioral disorder, hallucinations, memory difficulties, mood swings or suicidal ideation Ophthalmic ROS: negative for - blurry vision, double vision, eye pain or loss of vision ENT ROS: negative for - epistaxis, nasal discharge, oral lesions, sore throat, tinnitus or vertigo Allergy and Immunology ROS: negative for - hives or itchy/watery eyes Hematological and Lymphatic ROS: negative for - bleeding problems, bruising or swollen lymph nodes Endocrine ROS: negative for - galactorrhea, hair pattern changes, polydipsia/polyuria or temperature intolerance Respiratory ROS: negative for - cough, hemoptysis, shortness of breath or wheezing Cardiovascular ROS: negative for - chest pain, dyspnea on exertion, edema or irregular heartbeat Gastrointestinal ROS: negative for - abdominal pain, diarrhea, hematemesis, nausea/vomiting or stool incontinence Genito-Urinary ROS: negative for - dysuria, hematuria, incontinence or urinary frequency/urgency Musculoskeletal ROS: negative for - joint swelling or muscular weakness Neurological ROS: as noted in HPI, numbness on lateral aspect of right thigh Dermatological ROS: negative for rash and skin lesion changes  Physical Examination: Blood pressure 108/64, pulse (!) 144, temperature 98.3 F (36.8 C), temperature source Oral, resp. rate 16, SpO2 98 %, unknown if currently breastfeeding.  HEENT-  Normocephalic, no lesions, without obvious abnormality.  Normal external eye and conjunctiva.  Normal TM's bilaterally.  Normal auditory canals and external  ears. Normal external nose, mucus membranes and septum.  Normal pharynx. Cardiovascular- S1, S2 normal, pulses palpable throughout   Lungs- chest clear, no wheezing, rales, normal symmetric air entry Abdomen- soft, non-tender; bowel sounds normal; no masses,  no organomegaly Extremities- no edema Lymph-no adenopathy palpable Musculoskeletal-no joint tenderness, deformity or swelling Skin-warm and dry, no hyperpigmentation, vitiligo, or suspicious lesions  Neurological Examination   Mental Status: Alert, oriented, thought content appropriate.  Speech fluent without evidence of aphasia.  Able to follow 3 step commands without difficulty. Cranial Nerves: II: Discs flat bilaterally; Visual fields grossly normal, pupils equal, round, reactive to light and accommodation III,IV, VI: ptosis not present, extra-ocular motions intact bilaterally V,VII: smile symmetric, facial light touch sensation normal bilaterally VIII: hearing normal bilaterally IX,X: gag reflex present XI: bilateral shoulder shrug XII: midline tongue extension Motor: Right : Upper extremity   5/5    Left:     Upper extremity   5/5  Lower extremity   5/5     Lower extremity   5/5 Tone and bulk:normal tone throughout; no atrophy noted Sensory: Pinprick and light touch intact throughout, bilaterally Deep Tendon Reflexes: 2+ and symmetric throughout Plantars: Right: downgoing   Left: downgoing Cerebellar: normal finger-to-nose, normal rapid alternating movements and normal heel-to-shin test Gait: not tested due to safety concerns    Laboratory Studies:   Basic Metabolic Panel:  Recent Labs Lab 08/25/16 0446  NA 139  K 3.6  CL 108  CO2 24  GLUCOSE 122*  BUN 15  CREATININE 0.54  CALCIUM 8.8*    Liver Function Tests:  Recent Labs Lab 08/25/16 0446  AST 23  ALT 25  ALKPHOS 58  BILITOT 0.6  PROT 6.7  ALBUMIN 3.6   No results for input(s): LIPASE, AMYLASE in the last 168 hours. No results for input(s):  AMMONIA in the last 168 hours.  CBC:  Recent Labs Lab 08/25/16 0446  WBC 6.5  NEUTROABS 2.6  HGB 13.1  HCT 38.4  MCV 88.1  PLT 195    Cardiac Enzymes: No results for input(s): CKTOTAL, CKMB, CKMBINDEX, TROPONINI in the last 168 hours.  BNP: Invalid input(s): POCBNP  CBG: No results for input(s): GLUCAP in the last 168 hours.  Microbiology: Results for orders placed or performed in visit on 06/24/16  GC/Chlamydia Probe Amp     Status: None   Collection Time: 07/01/16  3:01 PM  Result Value Ref Range Status   Chlamydia trachomatis, NAA Negative Negative Final   Neisseria gonorrhoeae by PCR Negative Negative Final  Strep Gp B NAA     Status: Abnormal   Collection Time: 07/01/16  3:01 PM  Result Value Ref Range Status   Strep Gp B NAA Positive (A) Negative Final    Comment: Centers for Disease Control and Prevention (CDC) and American Congress of Obstetricians and Gynecologists (ACOG) guidelines for prevention of perinatal group B streptococcal (GBS) disease specify co-collection of a vaginal and rectal swab specimen to maximize sensitivity of GBS detection. Per the CDC and ACOG, swabbing both the lower vagina and rectum substantially increases the yield of detection compared with sampling the vagina alone. Penicillin G, ampicillin, or cefazolin are indicated for intrapartum prophylaxis of  perinatal GBS colonization. Reflex susceptibility testing should be performed prior to use of clindamycin only on GBS isolates from penicillin-allergic women who are considered a high risk for anaphylaxis. Treatment with vancomycin without additional testing is warranted if resistance to clindamycin is noted.     Coagulation Studies: No results for input(s): LABPROT, INR in the last 72 hours.  Urinalysis:  Recent Labs Lab 08/25/16 0621  COLORURINE YELLOW*  LABSPEC 1.014  PHURINE 6.0  GLUCOSEU NEGATIVE  HGBUR SMALL*  BILIRUBINUR NEGATIVE  KETONESUR NEGATIVE  PROTEINUR  NEGATIVE  NITRITE NEGATIVE  LEUKOCYTESUR MODERATE*    Lipid Panel:  No results found for: CHOL, TRIG, HDL, CHOLHDL, VLDL, LDLCALC  HgbA1C:  Lab Results  Component Value Date   HGBA1C 5.4 12/25/2015    Urine Drug Screen:     Component Value Date/Time   LABOPIA NONE DETECTED 08/25/2016 0621   COCAINSCRNUR NONE DETECTED 08/25/2016 0621   LABBENZ NONE DETECTED 08/25/2016 0621   AMPHETMU NONE DETECTED 08/25/2016 0621   THCU NONE DETECTED 08/25/2016 0621   LABBARB NONE DETECTED 08/25/2016 0621    Alcohol Level: No results for input(s): ETH in the last 168 hours.  Other results: EKG: sinus tachycardia at 107 bpm.  Imaging: Mr Shirlee Latch ZO Contrast  Result Date: 08/25/2016 CLINICAL DATA:  3 week postpartum with seizure. Remote history of seizure disorder. EXAM: MRI HEAD WITHOUT CONTRAST MRA HEAD WITHOUT CONTRAST MRI HEAD VENOGRAM WITHOUT CONTRAST TECHNIQUE: Multiplanar, multiecho pulse sequences of the brain and surrounding structures were obtained without intravenous contrast. Angiographic images of the head were obtained using MRA and MRV technique without contrast. COMPARISON:  None. FINDINGS: MRI HEAD FINDINGS Brain: The cerebellar tonsils are low lying. The midline structures are otherwise normal. Very mild periventricular hyperintensity adjacent to the occipital horns. The brain parenchymal signal is normal. No mass lesion. No chronic microhemorrhage or cerebral amyloid angiopathy. No hydrocephalus, age advanced atrophy or lobar predominant volume loss. No dural abnormality or extra-axial collection. Skull and upper cervical spine: The visualized skull base, calvarium, upper cervical spine and extracranial soft tissues are normal. Sinuses/Orbits: No fluid levels or advanced mucosal thickening. No mastoid effusion. Normal orbits. MRA HEAD FINDINGS Intracranial internal carotid arteries: Normal. Anterior cerebral arteries: Normal. Middle cerebral arteries: Normal. Posterior communicating  arteries: Present bilaterally. Posterior cerebral arteries: Normal. Basilar artery: Normal. Vertebral arteries: Left dominant. Normal. Superior cerebellar arteries: Normal. Anterior inferior cerebellar arteries: Normal. Posterior inferior cerebellar arteries: Normal. MRV HEAD FINDINGS Superior sagittal sinus: Normal. Straight sinus: Normal. Inferior sagittal sinus, vein of Galen and internal cerebral veins: Normal. Transverse sinuses: Normal right, diminutive left. This is a normal congenital variant. Sigmoid sinuses: Normal on the right. Diminutive left sigmoid sinus is a normal congenital variant. Visualized jugular veins: Normal. IMPRESSION: 1. Normal MRI of the brain. 2. Normal intracranial MRA/MRV. Electronically Signed   By: Deatra Robinson M.D.   On: 08/25/2016 13:38   Mr Brain Wo Contrast  Result Date: 08/25/2016 CLINICAL DATA:  3 week postpartum with seizure. Remote history of seizure disorder. EXAM: MRI HEAD WITHOUT CONTRAST MRA HEAD WITHOUT CONTRAST MRI HEAD VENOGRAM WITHOUT CONTRAST TECHNIQUE: Multiplanar, multiecho pulse sequences of the brain and surrounding structures were obtained without intravenous contrast. Angiographic images of the head were obtained using MRA and MRV technique without contrast. COMPARISON:  None. FINDINGS: MRI HEAD FINDINGS Brain: The cerebellar tonsils are low lying. The midline structures are otherwise normal. Very mild periventricular hyperintensity adjacent to the occipital horns. The brain parenchymal signal is normal. No mass lesion. No  chronic microhemorrhage or cerebral amyloid angiopathy. No hydrocephalus, age advanced atrophy or lobar predominant volume loss. No dural abnormality or extra-axial collection. Skull and upper cervical spine: The visualized skull base, calvarium, upper cervical spine and extracranial soft tissues are normal. Sinuses/Orbits: No fluid levels or advanced mucosal thickening. No mastoid effusion. Normal orbits. MRA HEAD FINDINGS Intracranial  internal carotid arteries: Normal. Anterior cerebral arteries: Normal. Middle cerebral arteries: Normal. Posterior communicating arteries: Present bilaterally. Posterior cerebral arteries: Normal. Basilar artery: Normal. Vertebral arteries: Left dominant. Normal. Superior cerebellar arteries: Normal. Anterior inferior cerebellar arteries: Normal. Posterior inferior cerebellar arteries: Normal. MRV HEAD FINDINGS Superior sagittal sinus: Normal. Straight sinus: Normal. Inferior sagittal sinus, vein of Galen and internal cerebral veins: Normal. Transverse sinuses: Normal right, diminutive left. This is a normal congenital variant. Sigmoid sinuses: Normal on the right. Diminutive left sigmoid sinus is a normal congenital variant. Visualized jugular veins: Normal. IMPRESSION: 1. Normal MRI of the brain. 2. Normal intracranial MRA/MRV. Electronically Signed   By: Deatra Robinson M.D.   On: 08/25/2016 13:38   Mr Susie Cassette Head  Result Date: 08/25/2016 CLINICAL DATA:  3 week postpartum with seizure. Remote history of seizure disorder. EXAM: MRI HEAD WITHOUT CONTRAST MRA HEAD WITHOUT CONTRAST MRI HEAD VENOGRAM WITHOUT CONTRAST TECHNIQUE: Multiplanar, multiecho pulse sequences of the brain and surrounding structures were obtained without intravenous contrast. Angiographic images of the head were obtained using MRA and MRV technique without contrast. COMPARISON:  None. FINDINGS: MRI HEAD FINDINGS Brain: The cerebellar tonsils are low lying. The midline structures are otherwise normal. Very mild periventricular hyperintensity adjacent to the occipital horns. The brain parenchymal signal is normal. No mass lesion. No chronic microhemorrhage or cerebral amyloid angiopathy. No hydrocephalus, age advanced atrophy or lobar predominant volume loss. No dural abnormality or extra-axial collection. Skull and upper cervical spine: The visualized skull base, calvarium, upper cervical spine and extracranial soft tissues are normal.  Sinuses/Orbits: No fluid levels or advanced mucosal thickening. No mastoid effusion. Normal orbits. MRA HEAD FINDINGS Intracranial internal carotid arteries: Normal. Anterior cerebral arteries: Normal. Middle cerebral arteries: Normal. Posterior communicating arteries: Present bilaterally. Posterior cerebral arteries: Normal. Basilar artery: Normal. Vertebral arteries: Left dominant. Normal. Superior cerebellar arteries: Normal. Anterior inferior cerebellar arteries: Normal. Posterior inferior cerebellar arteries: Normal. MRV HEAD FINDINGS Superior sagittal sinus: Normal. Straight sinus: Normal. Inferior sagittal sinus, vein of Galen and internal cerebral veins: Normal. Transverse sinuses: Normal right, diminutive left. This is a normal congenital variant. Sigmoid sinuses: Normal on the right. Diminutive left sigmoid sinus is a normal congenital variant. Visualized jugular veins: Normal. IMPRESSION: 1. Normal MRI of the brain. 2. Normal intracranial MRA/MRV. Electronically Signed   By: Deatra Robinson M.D.   On: 08/25/2016 13:38     Assessment/Plan: 31 year old female presenting after a seizure.  Patient with a history of seizures.  Has been seizure free for about 7-8 years on no medications.  Neurological examination nonfocal.  MRI/MRA and MRV reviewed and unremarkable. EEG shows generalized discharges and with history, suspect JME as possible seizure diagnosis.   Recommendations: 1.  D/C Magnesium 2.  Keppra 1000mg  IV now with maintenance of 500mg  BID po.  To start po dose in AM.  Possible side effects discussed.   3.  Seizure precautions 4.  Patient unable to drive, operate heavy machinery, perform activities at heights and participate in water activities until release by outpatient physician..  To follow up with neurology on an outpatient basis.      Thana Farr, MD Neurology [phone] 08/25/2016,  3:48 PM

## 2016-09-13 ENCOUNTER — Encounter: Payer: Self-pay | Admitting: Certified Nurse Midwife

## 2016-09-13 ENCOUNTER — Ambulatory Visit (INDEPENDENT_AMBULATORY_CARE_PROVIDER_SITE_OTHER): Payer: BLUE CROSS/BLUE SHIELD | Admitting: Certified Nurse Midwife

## 2016-09-13 NOTE — Patient Instructions (Addendum)
Postprandial Glucose Test The postprandial glucose test is used to screen for diabetes. This test involves collecting a sample of your blood 2 hours after you have eaten a meal or consume a sugar drink. The sample is used to determine your blood sugar (glucose) level. Your health care provider may recommend that you have this test if he or she thinks that you may have diabetes. The test can help confirm the diagnosis. How do I prepare for this test? You will need to eat a meal or drink a sugar load that includes at least 75 milligrams (mg) of carbohydrate. The meal should include high-carbohydrate foods, such as breads, pasta, potatoes, and starchy foods. After eating this meal, you should eat nothing else until after the blood sample is taken 2 hours later. Make sure you avoid snacking during this time. Eating anything can interfere with test results. Do not smoke or exercise during the testing period. What do the results mean? It is your responsibility to obtain your test results. Ask the lab or department performing the test when and how you will get your results. Contact your health care provider to discuss any questions you have about your results. Range of Normal Values Ranges for normal values may vary among different labs and hospitals. You should always check with your health care provider after having lab work or other tests done to discuss whether your values are considered within normal limits. Normal levels of blood glucose are as follows:  740-30 years old: less than 140 mg/dL or less than 7.8 mmol/L (SI units).  3150-30 years old: less than 150 mg/dL.  30 years old and older: less than 160 mg/dL.  Some situations can interfere with the results of the postprandial glucose test. These may include:  Experiencing high levels of stress around the time of the test.  Eating a snack or candy during the testing time.  Exercising during the testing time.  Being unable to eat a full,  carbohydrate-rich meal before the test.  Meaning of Results Outside Normal Value Ranges Test results that are above normal values may indicate a number of health problems, such as:  Diabetes mellitus.  Diabetes that develops during pregnancy (gestational diabetes).  Malnutrition.  Hyperthyroidism.  Acute stress response.  Cushing syndrome.  Tumors such as pheochromocytoma or glucagonoma.  Kidney failure.  Acromegaly.  Liver disease.  Use of medicines such as diuretics or corticosteroids.  Discuss your test results with your health care provider. He or she will use the results to make a diagnosis and determine a treatment plan that is right for you. Talk with your health care provider to discuss your results, treatment options, and if necessary, the need for more tests. Talk with your health care provider if you have any questions about your results. This information is not intended to replace advice given to you by your health care provider. Make sure you discuss any questions you have with your health care provider. Document Released: 04/14/2004 Document Revised: 11/26/2015 Document Reviewed: 07/27/2013 Elsevier Interactive Patient Education  [employer]2018 [employer]

## 2016-09-13 NOTE — Progress Notes (Signed)
Subjective:    Brooke Fuller is a 31 y.o. G1P0 Caucasian female who presents for a postpartum visit. She is 6 weeks postpartum following a spontaneous vaginal delivery and vacuum, outlet at 41+1 gestational weeks. Anesthesia: epidural. I have fully reviewed the prenatal and intrapartum course.   Postpartum course has been complicated by return of seizures. Pt being followed by neurology and currently taking 500mg  Keppra BID.    Baby's course has been uncomplicated. Baby is feeding by both breast and bottle - Similac Advance.   Bleeding no bleeding. Bowel function is normal. Bladder function is normal. Patient is not sexually active.  Contraception method is condoms. Postpartum depression screening: negative. Score 0.  Last pap 2016 and was negative.  The following portions of the patient's history were reviewed and updated as appropriate: allergies, current medications, past medical history, past surgical history and problem list.  Review of Systems Pertinent items are noted in HPI.   Vitals:   09/13/16 1354  BP: 112/60  Pulse: 79  Weight: 259 lb 11.2 oz (117.8 kg)  Height: 5\' 4"  (1.626 m)   [facility] 09[facility]/2016  Decreased Interest 0  Down, Depressed, Hopeless 0  PHQ - 2 Score 0  Altered sleeping 0  Tired, decreased energy 0  Change in appetite 0  Feeling bad or failure about yourself  0  Trouble concentrating 0  Moving slowly or fidgety/restless 0  Suicidal thoughts 0  PHQ-9 Score 0    Objective:   General:  alert, cooperative and no distress   Breasts:  deferred, no complaints  Lungs: clear to auscultation bilaterally  Heart:  regular rate and rhythm  Abdomen: soft, nontender   Vulva: normal  Vagina: normal vagina  Cervix:  closed  Corpus: Well-involuted  Adnexa:  Non-palpable  Rectal Exam: No hemorrhoids present        Assessment:   Postpartum exam Six (6) wks s/p spontaneous vaginal birth, vacuum assisted Breastfeeding and Formula  Feeding Depression screening Contraception counseling   Plan:   May resume sexual activity as desired.  Follow up in: 6 weeks for postpartum glucose testing or earlier if needed.   Gunnar BullaJenkins Michelle Balin Vandegrift, CNM

## 2016-12-02 ENCOUNTER — Ambulatory Visit
Admission: EM | Admit: 2016-12-02 | Discharge: 2016-12-02 | Disposition: A | Discharge status: Home / Self Care | Payer: BLUE CROSS/BLUE SHIELD | Attending: Family Medicine | Admitting: Family Medicine

## 2016-12-02 ENCOUNTER — Encounter: Payer: Self-pay | Admitting: *Deleted

## 2016-12-02 DIAGNOSIS — B349 Viral infection, unspecified: Secondary | ICD-10-CM

## 2016-12-02 LAB — CBC WITH DIFFERENTIAL/PLATELET
Basophils Absolute: 0.1 10*3/uL (ref 0–0.1)
Basophils Relative: 1 %
Eosinophils Absolute: 0.1 10*3/uL (ref 0–0.7)
Eosinophils Relative: 1 %
HCT: 39.9 % (ref 35.0–47.0)
Hemoglobin: 13.6 g/dL (ref 12.0–16.0)
Lymphocytes Relative: 8 %
Lymphs Abs: 0.6 10*3/uL — ABNORMAL LOW (ref 1.0–3.6)
MCH: 29 pg (ref 26.0–34.0)
MCHC: 34.1 g/dL (ref 32.0–36.0)
MCV: 85.1 fL (ref 80.0–100.0)
Monocytes Absolute: 1 10*3/uL — ABNORMAL HIGH (ref 0.2–0.9)
Monocytes Relative: 14 %
Neutro Abs: 5.4 10*3/uL (ref 1.4–6.5)
Neutrophils Relative %: 76 %
Platelets: 170 10*3/uL (ref 150–440)
RBC: 4.69 MIL/uL (ref 3.80–5.20)
RDW: 13.3 % (ref 11.5–14.5)
WBC: 7.2 10*3/uL (ref 3.6–11.0)

## 2016-12-02 LAB — URINALYSIS, COMPLETE (UACMP) WITH MICROSCOPIC
Bacteria, UA: NONE SEEN
Bilirubin Urine: NEGATIVE
Glucose, UA: NEGATIVE mg/dL
Hgb urine dipstick: NEGATIVE
Ketones, ur: NEGATIVE mg/dL
Nitrite: NEGATIVE
Protein, ur: NEGATIVE mg/dL
RBC / HPF: NONE SEEN RBC/hpf (ref 0–5)
Specific Gravity, Urine: 1.02 (ref 1.005–1.030)
pH: 8.5 — ABNORMAL HIGH (ref 5.0–8.0)

## 2016-12-02 LAB — MONONUCLEOSIS SCREEN: Mono Screen: NEGATIVE

## 2016-12-02 LAB — RAPID STREP SCREEN (MED CTR MEBANE ONLY): Streptococcus, Group A Screen (Direct): NEGATIVE

## 2016-12-02 MED ORDER — ACETAMINOPHEN 500 MG PO TABS
1000.0000 mg | ORAL_TABLET | Freq: Once | ORAL | Status: AC
  Administered 2016-12-02: 1000 mg via ORAL

## 2016-12-02 NOTE — ED Triage Notes (Signed)
Fever, body aches, and fatigue, onset yesterday. Temp up to 101 today.

## 2016-12-02 NOTE — Discharge Instructions (Signed)
Drink plenty of fluids. Plenty of rest. Use Tylenol or Motrin to control fever and aches. If you worsen go to the emergency room or return to our clinic.

## 2016-12-02 NOTE — ED Provider Notes (Addendum)
[facility]    CSN: [id number] Arrival date & time: 12/02/16  1836     History   Chief Complaint Chief Complaint  Patient presents with  . Fever  . Fatigue  . Generalized Body Aches    HPI Jamariah Tony is a 31 y.o. female.   HPI  This a 31 year old female who presents with fever currently 101.1 pulse rate of 115 body aches and severe fatigue with a slight sore throat. The onset yesterday. She has no history of exposure to a sick contact. She denies any abdominal pain no nausea no vomiting no diarrhea. She has not noticed any rash. She does not know of any exposure to ticks. She has cats at home but they are indoor only animals. Denies any cough or sinus drainage.         Past Medical History:  Diagnosis Date  . Gestational diabetes   . PCOS (polycystic ovarian syndrome)   . PCOS (polycystic ovarian syndrome)   . Seizures [facility])     Patient Active Problem List   Diagnosis Date Noted  . Seizure (HCC) 08/25/2016  . Postpartum spinal headache 08/06/2016  . Gestational diabetes 07/12/2016  . Morbid obesity with body mass index (BMI) of 45.0 to 49.9 in adult [facility]) 12/30/2015  . Rubella non-immune status, antepartum 12/30/2015  . Asthma, history 12/16/2015  . History of seizures 12/16/2015    Past Surgical History:  Procedure Laterality Date  . NO PAST SURGERIES      OB History    Gravida Para Term Preterm AB Living   1 1 1     1    SAB TAB Ectopic Multiple Live Births           1      Obstetric Comments   Adopted son. 39month now       Home Medications    Prior to Admission medications   Medication Sig Start Date End Date Taking? Authorizing Provider  ibuprofen (ADVIL,MOTRIN) 600 MG tablet Take 1 tablet (600 mg total) by mouth every 6 (six) hours. 08/04/16   Lawhorn, Vanessa [place], CNM  levETIRAcetam (KEPPRA) 500 MG tablet Take 1 tablet (500 mg total) by mouth 2 (two) times daily. 08/25/16   Defrancesco, Prentice Docker, MD  Prenatal Vit-Fe  Fumarate-FA (PRENATAL MULTIVITAMIN) TABS tablet Take 1 tablet by mouth daily at 12 noon.    [provider]  simethicone (MYLICON) 80 MG chewable tablet Chew 1 tablet (80 mg total) by mouth as needed for flatulence. Patient not taking: Reported on 09/13/2016 08/04/16   [name], CNM  vitamin B-12 (CYANOCOBALAMIN) 1000 MCG tablet Take 1,000 mcg by mouth daily.    [provider]    Family History Family History  Problem Relation Age of Onset  . Alzheimer's disease Mother   . Seizures Mother   . Cancer Maternal Grandmother   . Heart failure Maternal Grandfather   . Hypertension Maternal Grandfather   . Stroke Maternal Grandfather     Social History Social History  Substance Use Topics  . Smoking status: Never Smoker  . Smokeless tobacco: Never Used  . Alcohol use No     Comment: None since pregnancy     Allergies   Red dye   Review of Systems Review of Systems  Constitutional: Positive for activity change, chills, fatigue and fever.  HENT: Positive for sore throat. Negative for congestion and ear pain.   Respiratory: Negative for cough and shortness of breath.   All other systems  reviewed and are negative.    Physical Exam Triage Vital Signs ED Triage Vitals  Enc Vitals Group     BP 12/02/16 1856 113/69     Pulse Rate 12/02/16 1856 (!) 115     Resp 12/02/16 1856 16     Temp 12/02/16 1856 (!) 101.1 F (38.4 C)     Temp Source 12/02/16 1856 Oral     SpO2 12/02/16 1856 99 %     Weight 12/02/16 1858 235 lb (106.6 kg)     Height 12/02/16 1858 5\' 4"  (1.626 m)     Head Circumference --      Peak Flow --      Pain Score --      Pain Loc --      Pain Edu? --      Excl. in GC? --    No data found.   Updated Vital Signs BP 113/69 (BP Location: Left Arm)   Pulse (!) 115   Temp (!) 101.1 F (38.4 C) (Oral)   Resp 16   Ht 5\' 4"  (1.626 m)   Wt 235 lb (106.6 kg)   SpO2 99%   BMI 40.34 kg/m   Visual Acuity Right Eye Distance:     Left Eye Distance:   Bilateral Distance:    Right Eye Near:   Left Eye Near:    Bilateral Near:     Physical Exam  Constitutional: She is oriented to person, place, and time. She appears well-developed and well-nourished. No distress.  HENT:  Head: Normocephalic and atraumatic.  Right Ear: External ear normal.  Left Ear: External ear normal.  Nose: Nose normal.  Mouth/Throat: Oropharynx is clear and moist. No oropharyngeal exudate.  Eyes: Pupils are equal, round, and reactive to light. Right eye exhibits no discharge. Left eye exhibits no discharge.  Neck: Normal range of motion. Neck supple.  Pulmonary/Chest: Effort normal and breath sounds normal. No respiratory distress. She has no wheezes. She has no rales.  Musculoskeletal: Normal range of motion.  Lymphadenopathy:    She has no cervical adenopathy.  Neurological: She is alert and oriented to person, place, and time.  Skin: Skin is warm and dry. She is not diaphoretic.  Psychiatric: She has a normal mood and affect. Her behavior is normal. Judgment and thought content normal.  Nursing note and vitals reviewed.    UC Treatments / Results  Labs (all labs ordered are listed, but only abnormal results are displayed) Labs Reviewed  CBC WITH DIFFERENTIAL/PLATELET - Abnormal; Notable for the following:       Result Value   Lymphs Abs 0.6 (*)    Monocytes Absolute 1.0 (*)    All other components within normal limits  URINALYSIS, COMPLETE (UACMP) WITH MICROSCOPIC - Abnormal; Notable for the following:    pH 8.5 (*)    Leukocytes, UA TRACE (*)    Squamous Epithelial / LPF 6-30 (*)    All other components within normal limits  RAPID STREP SCREEN (NOT AT [facility])  CULTURE, GROUP A STREP [facility]  MONONUCLEOSIS SCREEN    EKG  EKG Interpretation None       Radiology No results found.  Procedures Procedures (including critical care time)  Medications Ordered in UC Medications  acetaminophen (TYLENOL) tablet 1,000 mg  (1,000 mg Oral Given 12/02/16 1900)     Initial Impression / Assessment and Plan / UC Course  I have reviewed the triage vital signs and the nursing notes.  Pertinent labs & imaging results that  were available during my care of the patient were reviewed by me and considered in my medical decision making (see chart for details).     Plan: 1. Test/x-ray results and diagnosis reviewed with patient 2. rx as per orders; risks, benefits, potential side effects reviewed with patient 3. Recommend supportive treatment with rest and fluids. Use Tylenol or Motrin for fever and body aches. If she is not improving she will go the emergency room or follow up in our clinic. He does not have a local primary care physician yet. Is no identifiable source of her illness at this point in time. If this is a virus which it most likely is normal throughout its course. I have told her that the pharyngeal swab will be available in 48 hours. 4. F/u prn if symptoms worsen or don't improve   Final Clinical Impressions(s) / UC Diagnoses   Final diagnoses:  Viral illness    New Prescriptions New Prescriptions   No medications on file     Controlled Substance Prescriptions [place] Controlled Substance Registry consulted? Not Applicable   Lutricia Feil, PA-C 12/02/16 2034    Lutricia Feil, PA-C 12/02/16 [phone]

## 2016-12-05 LAB — CULTURE, GROUP A STREP (THRC)

## 2018-01-02 ENCOUNTER — Encounter: Payer: Self-pay | Admitting: Emergency Medicine

## 2018-01-02 ENCOUNTER — Other Ambulatory Visit: Payer: Self-pay

## 2018-01-02 ENCOUNTER — Ambulatory Visit
Admission: EM | Admit: 2018-01-02 | Discharge: 2018-01-02 | Disposition: A | Discharge status: Home / Self Care | Payer: BLUE CROSS/BLUE SHIELD | Attending: Family Medicine | Admitting: Family Medicine

## 2018-01-02 DIAGNOSIS — R590 Localized enlarged lymph nodes: Secondary | ICD-10-CM | POA: Diagnosis not present

## 2018-01-02 NOTE — ED Provider Notes (Signed)
[facility]    CSN: [id number] Arrival date & time: 01/02/18  1715     History   Chief Complaint Chief Complaint  Patient presents with  . Neck Pain    HPI Brooke Fuller is a 32 y.o. female.   Patient is a 32 year old female who presents with complaint of painful lumps to her neck.  Patient reports the pain and swelling mostly on the right but also one small nodule on the left.  Patient denies any upper story symptoms including runny nose, sneezing, sore throat or ear pain.  Patient does report her kids have recently been ill with a stomach virus.  States right-sided swelling started about 1 week ago with pain starting to 3 days ago.  Patient that she has not taking anything due to the pain not being to the level of being discomfort.  Patient denies any dry mouth or difficulty swallowing food.     Past Medical History:  Diagnosis Date  . Gestational diabetes   . PCOS (polycystic ovarian syndrome)   . PCOS (polycystic ovarian syndrome)   . Seizures [facility])     Patient Active Problem List   Diagnosis Date Noted  . Seizure (HCC) 08/25/2016  . Postpartum spinal headache 08/06/2016  . Gestational diabetes 07/12/2016  . Morbid obesity with body mass index (BMI) of 45.0 to 49.9 in adult [facility]) 12/30/2015  . Rubella non-immune status, antepartum 12/30/2015  . Asthma, history 12/16/2015  . History of seizures 12/16/2015    Past Surgical History:  Procedure Laterality Date  . NO PAST SURGERIES      OB History    Gravida  1   Para  1   Term  1   Preterm      AB      Living  1     SAB      TAB      Ectopic      Multiple      Live Births  1        Obstetric Comments  Adopted son. 27month now         Home Medications    Prior to Admission medications   Medication Sig Start Date End Date Taking? Authorizing Provider  lamoTRIgine (LAMICTAL) 25 MG tablet Take 25 mg at night increase weekly by 25mg  until taking 5 tabs twice a day, follow  instructions given at appointment 01/17/18 02/14/18 Yes [provider]  levETIRAcetam (KEPPRA) 500 MG tablet Take 1 tablet (500 mg total) by mouth 2 (two) times daily. 08/25/16  Yes Defrancesco, Prentice Docker, MD  vitamin B-12 (CYANOCOBALAMIN) 1000 MCG tablet Take 1,000 mcg by mouth daily.   Yes [provider]  ibuprofen (ADVIL,MOTRIN) 600 MG tablet Take 1 tablet (600 mg total) by mouth every 6 (six) hours. 08/04/16   [name], CNM  Prenatal Vit-Fe Fumarate-FA (PRENATAL MULTIVITAMIN) TABS tablet Take 1 tablet by mouth daily at 12 noon.    [provider]  simethicone (MYLICON) 80 MG chewable tablet Chew 1 tablet (80 mg total) by mouth as needed for flatulence. Patient not taking: Reported on 09/13/2016 08/04/16   [name], CNM    Family History Family History  Problem Relation Age of Onset  . Alzheimer's disease Mother   . Seizures Mother   . Cancer Maternal Grandmother   . Heart failure Maternal Grandfather   . Hypertension Maternal Grandfather   . Stroke Maternal Grandfather     Social History Social History   Tobacco  Use  . Smoking status: Never Smoker  . Smokeless tobacco: Never Used  Substance Use Topics  . Alcohol use: No    Alcohol/week: 2.0 standard drinks    Types: 2 Glasses of wine per week    Comment: occasionally  . Drug use: No     Allergies   Red dye   Review of Systems Review of Systems as noted in HPI.  Other system reviewed and found to be negative.   Physical Exam Triage Vital Signs ED Triage Vitals  Enc Vitals Group     BP 01/02/18 1733 (!) 108/94     Pulse Rate 01/02/18 1733 86     Resp 01/02/18 1733 17     Temp 01/02/18 1733 98.1 F (36.7 C)     Temp Source 01/02/18 1733 Oral     SpO2 01/02/18 1733 100 %     Weight 01/02/18 1731 280 lb (127 kg)     Height 01/02/18 1731 5\' 4"  (1.626 m)     Head Circumference --      Peak Flow --      Pain Score 01/02/18 1731 1     Pain Loc --      Pain  Edu? --      Excl. in GC? --    No data found.  Updated Vital Signs BP (!) 108/94 (BP Location: Left Arm)   Pulse 86   Temp 98.1 F (36.7 C) (Oral)   Resp 17   Ht 5\' 4"  (1.626 m)   Wt 280 lb (127 kg)   LMP 12/02/2017 (Approximate)   SpO2 100%   BMI 48.06 kg/m   Visual Acuity Right Eye Distance:   Left Eye Distance:   Bilateral Distance:    Right Eye Near:   Left Eye Near:    Bilateral Near:     Physical Exam  Constitutional: She is oriented to person, place, and time. She appears well-developed and well-nourished.  HENT:  Right Ear: Hearing normal. No mastoid tenderness. Tympanic membrane is not injected. A middle ear effusion is present.  Left Ear: Hearing normal. There is mastoid tenderness. Tympanic membrane is not injected. A middle ear effusion is present.  Mouth/Throat: Uvula is midline and mucous membranes are normal. Posterior oropharyngeal erythema present. No tonsillar exudate.  Eyes: Pupils are equal, round, and reactive to light. EOM are normal.  Neck: Normal range of motion.  Single tender lymph node left cervical just below the mandible.  Right adenopathy of the neck, under the mandible.  No preauricular tenderness.  Slight mastoid tenderness on the left.  Cardiovascular: Normal rate, regular rhythm and normal heart sounds.  Pulmonary/Chest: Effort normal and breath sounds normal.  Abdominal: Soft.  Musculoskeletal: Normal range of motion.  Neurological: She is alert and oriented to person, place, and time. No cranial nerve deficit.  Skin: Skin is warm and dry.     UC Treatments / Results  Labs (all labs ordered are listed, but only abnormal results are displayed) Labs Reviewed - No data to display  EKG None  Radiology No results found.  Procedures Procedures (including critical care time)  Medications Ordered in UC Medications - No data to display  Initial Impression / Assessment and Plan / UC Course  I have reviewed the triage vital signs  and the nursing notes.  Pertinent labs & imaging results that were available during my care of the patient were reviewed by me and considered in my medical decision making (see chart for details).  Patient with isolated lymphadenopathy, mostly on the right.  Patient does report her children have had a stomach virus recently.  Ears with some effusion but no signs of actual infection.  Recommend patient to take Advil or Tylenol for pain.  Recommend watching closely as this is likely a viral infection.  Advised if no improvement in 2 weeks to return for further evaluation may be testing for EBV virus.  Final Clinical Impressions(s) / UC Diagnoses   Final diagnoses:  Cervical adenopathy     Discharge Instructions     -lymphadenopathy, likely viral in nature -Advil or Tylenol for pain -fluids -if no improvement in symptoms after two weeks, will need further work-up    ED Prescriptions    None     Controlled Substance Prescriptions [place] Controlled Substance Registry consulted? Not Applicable   Candis Schatz, PA-C 01/02/18 [place]

## 2018-01-02 NOTE — ED Triage Notes (Signed)
Pt c/o neck pain on the right side and a "lump" on her neck. Started about a week ago.

## 2018-01-02 NOTE — Discharge Instructions (Addendum)
-  lymphadenopathy, likely viral in nature -Advil or Tylenol for pain -fluids -if no improvement in symptoms after two weeks, will need further work-up

## 2018-04-10 IMAGING — MR MR HEAD W/O CM
12 series · 48 of 48 positions shown · non-contrast
Comparison: None.

CLINICAL DATA: 3 week postpartum with seizure. Remote history of
seizure disorder.

EXAM:
MRI HEAD WITHOUT CONTRAST
MRA HEAD WITHOUT CONTRAST
MRI HEAD VENOGRAM WITHOUT CONTRAST
TECHNIQUE: Multiplanar, multiecho pulse sequences of the brain and surrounding
structures were obtained without intravenous contrast. Angiographic
images of the head were obtained using MRA and MRV technique without
contrast.

[Series 2: TOF · axial · non-contrast · 0.7mm · 0.39mm/px · z∈[-49,+48]mm · 7 of 140 slices shown]
[im 1/140]
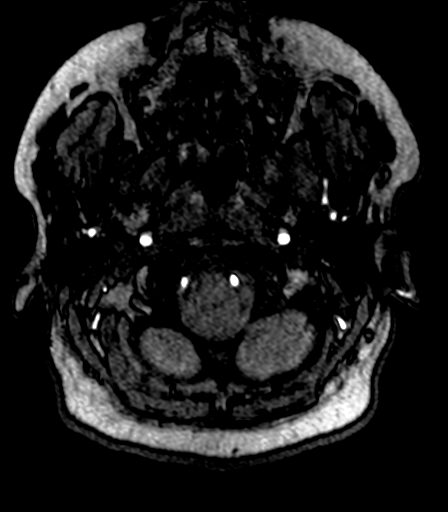
[im 24/140]
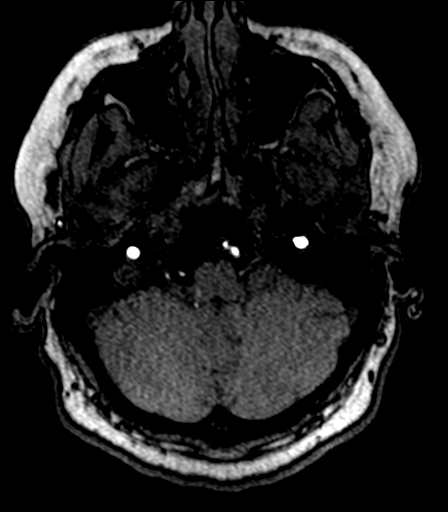
[im 47/140]
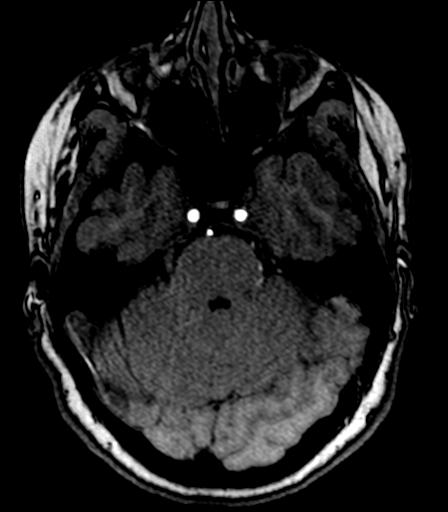
[im 70/140]
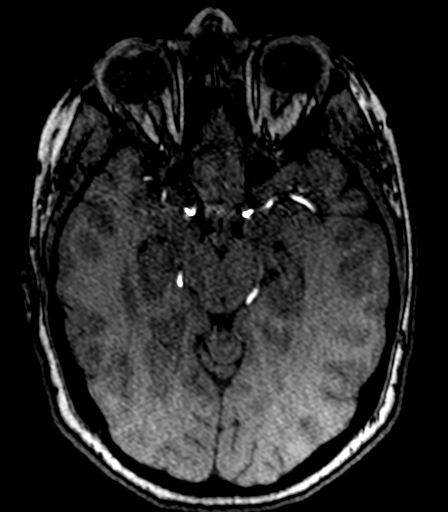
[im 93/140]
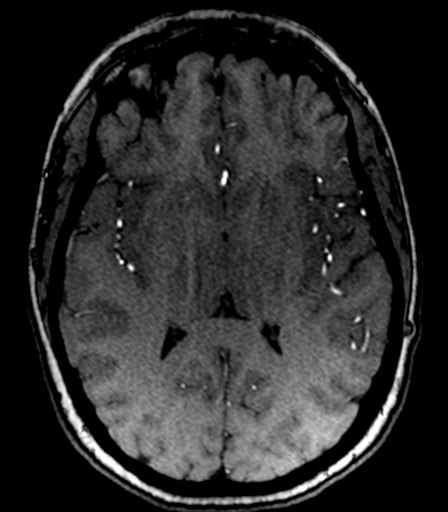
[im 116/140]
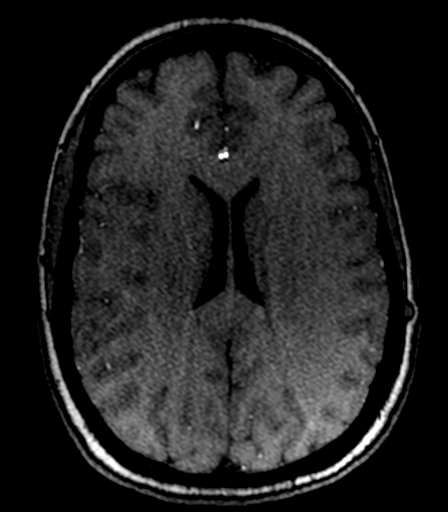
[im 140/140]
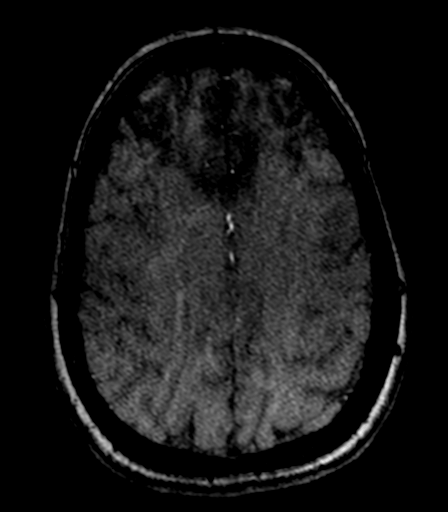

[Series 7: (id) · coronal · 3.0mm · 0.98mm/px · 5 of 90 slices shown]
[im 1/90]
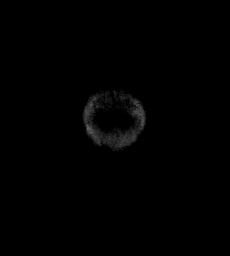
[im 23/90]
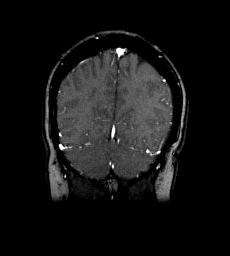
[im 45/90]
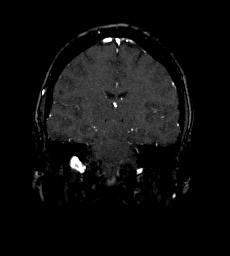
[im 67/90]
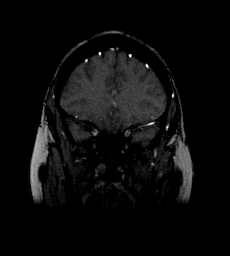
[im 90/90]
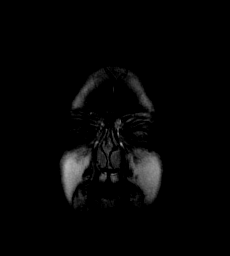

[Series 12: T1 · sagittal · 5.0mm · 0.45mm/px · 1 of 25 slices shown (1 of 2)]
[im 1/25]
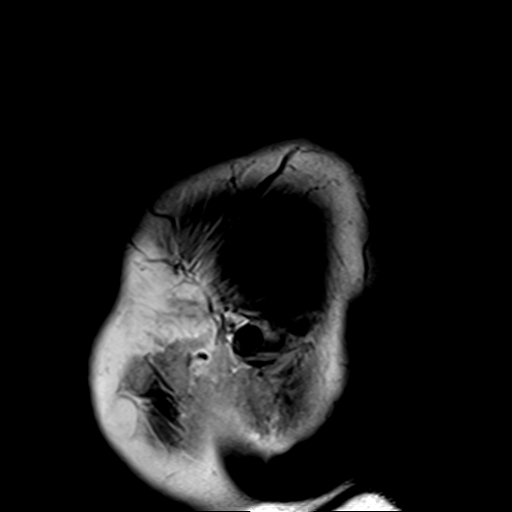

[Series 13: DWI · axial · 3.0mm · 1.80mm/px · z∈[-52,+94]mm · 8 of 150 slices shown (1 of 2)]
[im 1/150]
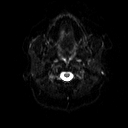
[im 22/150]
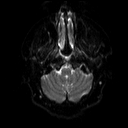
[im 43/150]
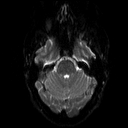
[im 64/150]
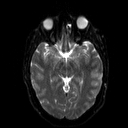
[im 86/150]
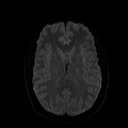
[im 107/150]
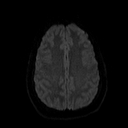
[im 128/150]
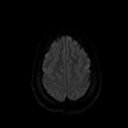
[im 150/150]
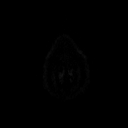

[Series 15: DWI · coronal · 3.0mm · 1.80mm/px · 7 of 134 slices shown (2 of 2)]
[im 1/134]
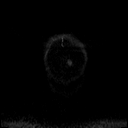
[im 23/134]
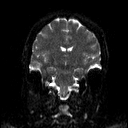
[im 45/134]
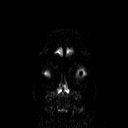
[im 67/134]
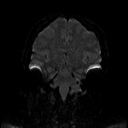
[im 89/134]
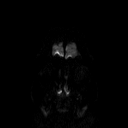
[im 111/134]
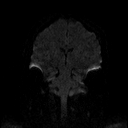
[im 134/134]
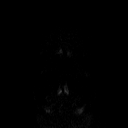

[Series 17: T2 · axial · 5.0mm · 0.60mm/px · 1 of 25 slices shown (1 of 3)]
[im 1/25]
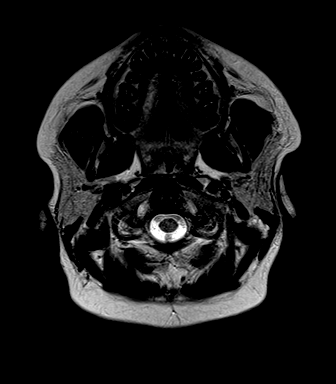

[Series 18: FLAIR · axial · 3.0mm · 0.45mm/px · z∈[-61,+94]mm · 3 of 53 slices shown]
[im 1/53]
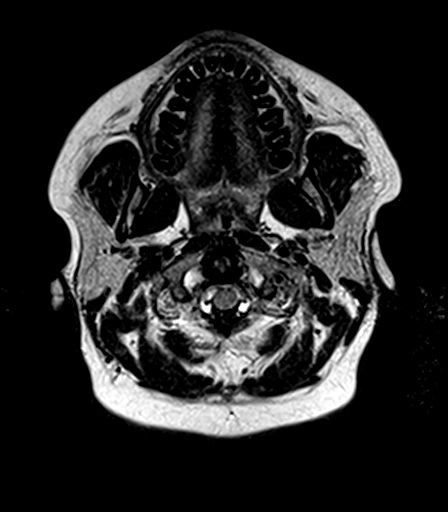
[im 27/53]
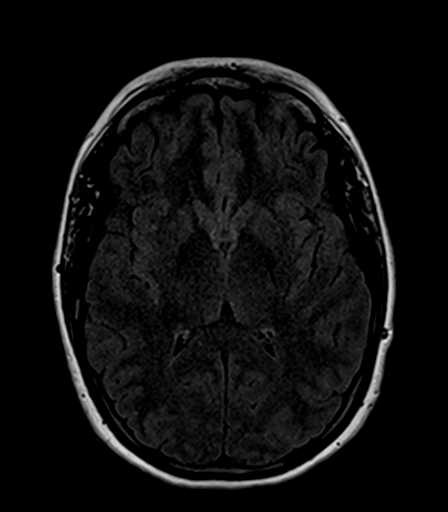
[im 53/53]
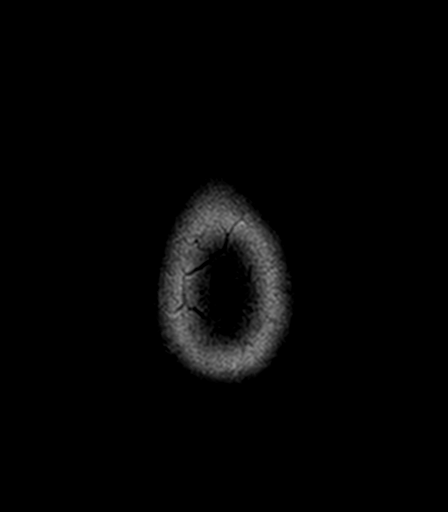

[Series 19: T2 · axial · 5.0mm · 0.45mm/px · 1 of 25 slices shown (2 of 3)]
[im 1/25]
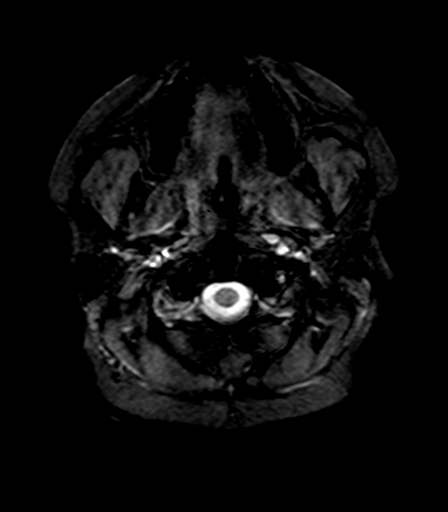

[Series 20: T1 · axial · 1.0mm · 1.00mm/px · z∈[-71,+104]mm · 9 of 176 slices shown (2 of 2)]
[im 1/176]
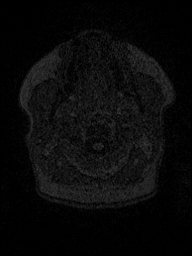
[im 22/176]
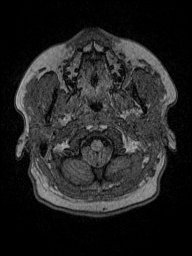
[im 44/176]
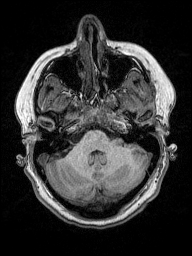
[im 66/176]
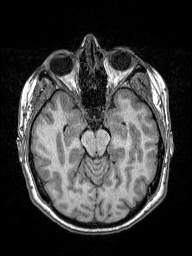
[im 88/176]
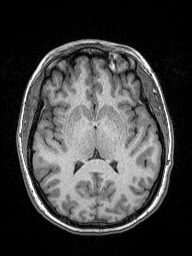
[im 110/176]
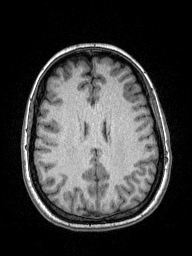
[im 132/176]
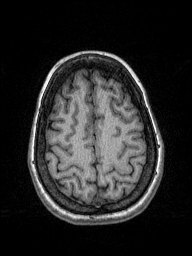
[im 154/176]
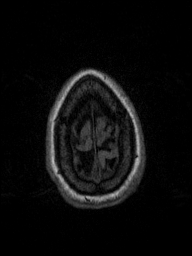
[im 176/176]
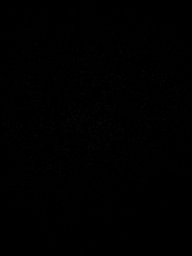

[Series 21: T2 · coronal · 5.0mm · 0.49mm/px · 1 of 27 slices shown (3 of 3)]
[im 1/27]
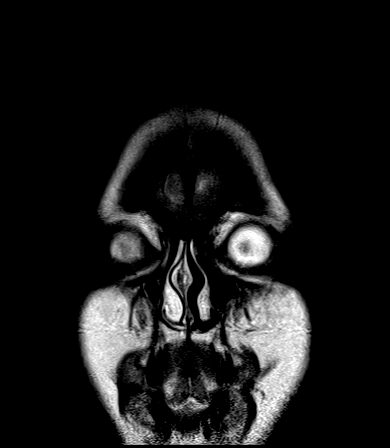

[Series 108: ax (id) · axial · 3.0mm · 1.80mm/px · z∈[-52,+94]mm · 3 of 50 slices shown]
[im 1/50]
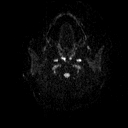
[im 25/50]
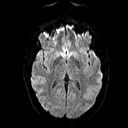
[im 50/50]
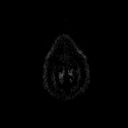

[Series 109: cor (id) · coronal · 3.0mm · 1.80mm/px · 2 of 44 slices shown]
[im 1/44]
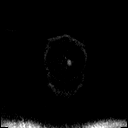
[im 44/44]
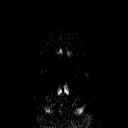

[48 of 48 positions shown; findings below may reference images not displayed]

FINDINGS: MRI HEAD FINDINGS

Brain: The cerebellar tonsils are low lying. The midline structures
are otherwise normal. Very mild periventricular hyperintensity
adjacent to the occipital horns. The brain parenchymal signal is
normal. No mass lesion. No chronic microhemorrhage or cerebral
amyloid angiopathy. No hydrocephalus, age advanced atrophy or lobar
predominant volume loss. No dural abnormality or extra-axial
collection.

Skull and upper cervical spine: The visualized skull base,
calvarium, upper cervical spine and extracranial soft tissues are
normal.

Sinuses/Orbits: No fluid levels or advanced mucosal thickening. No
mastoid effusion. Normal orbits.

MRA HEAD FINDINGS

Intracranial internal carotid arteries: Normal.

Anterior cerebral arteries: Normal.

Middle cerebral arteries: Normal.

Posterior communicating arteries: Present bilaterally.

Posterior cerebral arteries: Normal.

Basilar artery: Normal.

Vertebral arteries: Left dominant. Normal.

Superior cerebellar arteries: Normal.

Anterior inferior cerebellar arteries: Normal.

Posterior inferior cerebellar arteries: Normal.

MRV HEAD FINDINGS

Superior sagittal sinus: Normal.

Straight sinus: Normal.

Inferior sagittal sinus, vein of Sorin and internal cerebral veins:
Normal.

Transverse sinuses: Normal right, diminutive left. This is a normal
congenital variant.

Sigmoid sinuses: Normal on the right. Diminutive left sigmoid sinus
is a normal congenital variant.

Visualized jugular veins: Normal.
IMPRESSION: 1. Normal MRI of the brain.
2. Normal intracranial MRA/MRV.

## 2018-04-11 DIAGNOSIS — R569 Unspecified convulsions: Secondary | ICD-10-CM | POA: Diagnosis not present

## 2018-05-14 DIAGNOSIS — J0100 Acute maxillary sinusitis, unspecified: Secondary | ICD-10-CM | POA: Diagnosis not present

## 2020-11-03 ENCOUNTER — Encounter: Payer: Self-pay | Admitting: Certified Nurse Midwife

## 2020-11-03 ENCOUNTER — Ambulatory Visit (INDEPENDENT_AMBULATORY_CARE_PROVIDER_SITE_OTHER): Payer: Commercial Managed Care - PPO | Admitting: Certified Nurse Midwife

## 2020-11-03 ENCOUNTER — Other Ambulatory Visit: Payer: Self-pay

## 2020-11-03 ENCOUNTER — Other Ambulatory Visit (HOSPITAL_COMMUNITY)
Admission: RE | Admit: 2020-11-03 | Discharge: 2020-11-03 | Disposition: A | Discharge status: Home / Self Care | Payer: Self-pay | Source: Ambulatory Visit | Attending: Certified Nurse Midwife | Admitting: Certified Nurse Midwife

## 2020-11-03 VITALS — BP 135/63 | HR 98 | Ht 64.0 in | Wt 281.5 lb

## 2020-11-03 DIAGNOSIS — Z01419 Encounter for gynecological examination (general) (routine) without abnormal findings: Secondary | ICD-10-CM | POA: Diagnosis not present

## 2020-11-03 DIAGNOSIS — Z124 Encounter for screening for malignant neoplasm of cervix: Secondary | ICD-10-CM

## 2020-11-03 NOTE — Progress Notes (Signed)
GYNECOLOGY ANNUAL PREVENTATIVE CARE ENCOUNTER NOTE  History:     Brooke Fuller is a 35 y.o. G41P1001 female here for a routine annual gynecologic exam.  Current complaints: none.   Denies abnormal vaginal bleeding, discharge, pelvic pain, problems with intercourse or other gynecologic concerns.     Social Relationship: married  Living: with spouse and 2 children  Work: stay at home mom, home schools  Exercise: 20 min , 3-4 time wk peloton bike  Smoke/Alcohol/drug use: alcohol use once a month, denies drugs and smoking   Gynecologic History Patient's last menstrual period was 10/20/2020 (exact date). Contraception: none Last Pap: 2016. Results were: normal  Last mammogram: n/a .    Upstream - 11/03/20 1405       Pregnancy Intention Screening   Does the patient want to become pregnant in the next year? No    Does the patient's partner want to become pregnant in the next year? No    Would the patient like to discuss contraceptive options today? N/A      Contraception Wrap Up   Contraception Counseling Provided No            The pregnancy intention screening data noted above was reviewed. Potential methods of contraception were discussed. The patient elected to proceed with none.   Obstetric History OB History  Gravida Para Term Preterm AB Living  1 1 1     1   SAB IAB Ectopic Multiple Live Births          1    # Outcome Date GA Lbr Len/2nd Weight Sex Delivery Anes PTL Lv  1 Term 08/02/16 [redacted]w[redacted]d   F Vag-Vacuum EPI  LIV    Obstetric Comments  Adopted son. 34month now    Past Medical History:  Diagnosis Date   Gestational diabetes    PCOS (polycystic ovarian syndrome)    PCOS (polycystic ovarian syndrome)    Seizures (HCC)     Past Surgical History:  Procedure Laterality Date   NO PAST SURGERIES      Current Outpatient Medications on File Prior to Visit  Medication Sig Dispense Refill   ibuprofen (ADVIL,MOTRIN) 600 MG tablet Take 1 tablet (600 mg  total) by mouth every 6 (six) hours. (Patient not taking: Reported on 11/03/2020) 30 tablet 0   lamoTRIgine (LAMICTAL) 25 MG tablet Take 25 mg at night increase weekly by 25mg  until taking 5 tabs twice a day, follow instructions given at appointment     levETIRAcetam (KEPPRA) 500 MG tablet Take 1 tablet (500 mg total) by mouth 2 (two) times daily. (Patient not taking: Reported on 11/03/2020) 60 tablet 1   Prenatal Vit-Fe Fumarate-FA (PRENATAL MULTIVITAMIN) TABS tablet Take 1 tablet by mouth daily at 12 noon. (Patient not taking: Reported on 11/03/2020)     simethicone (MYLICON) 80 MG chewable tablet Chew 1 tablet (80 mg total) by mouth as needed for flatulence. (Patient not taking: No sig reported) 30 tablet 0   vitamin B-12 (CYANOCOBALAMIN) 1000 MCG tablet Take 1,000 mcg by mouth daily. (Patient not taking: Reported on 11/03/2020)     No current facility-administered medications on file prior to visit.    Allergies  Allergen Reactions   Red Dye Other (See Comments)    Severe nausea and vomiting    Social History:  reports that she has never smoked. She has never used smokeless tobacco. She reports that she does not drink alcohol and does not use drugs.  Family History  Problem Relation Age  of Onset   Alzheimer's disease Mother    Seizures Mother    Cancer Maternal Grandmother    Heart failure Maternal Grandfather    Hypertension Maternal Grandfather    Stroke Maternal Grandfather     The following portions of the patient's history were reviewed and updated as appropriate: allergies, current medications, past family history, past medical history, past social history, past surgical history and problem list.  Review of Systems Pertinent items noted in HPI and remainder of comprehensive ROS otherwise negative.  Physical Exam:  BP 135/63   Pulse 98   Ht 5\' 4"  (1.626 m)   Wt 281 lb 8 oz (127.7 kg)   LMP 10/20/2020 (Exact Date)   Breastfeeding Unknown   BMI 48.32 kg/m  CONSTITUTIONAL:  Well-developed, well-nourished , obese female in no acute distress.  HENT:  Normocephalic, atraumatic, External right and left ear normal. Oropharynx is clear and moist EYES: Conjunctivae and EOM are normal. Pupils are equal, round, and reactive to light. No scleral icterus.  NECK: Normal range of motion, supple, no masses.  Normal thyroid.  SKIN: Skin is warm and dry. No rash noted. Not diaphoretic. No erythema. No pallor. MUSCULOSKELETAL: Normal range of motion. No tenderness.  No cyanosis, clubbing, or edema.  2+ distal pulses. NEUROLOGIC: Alert and oriented to person, place, and time. Normal reflexes, muscle tone coordination.  PSYCHIATRIC: Normal mood and affect. Normal behavior. Normal judgment and thought content. CARDIOVASCULAR: Normal heart rate noted, regular rhythm RESPIRATORY: Clear to auscultation bilaterally. Effort and breath sounds normal, no problems with respiration noted. BREASTS: Symmetric in size. No masses, tenderness, skin changes, nipple drainage, or lymphadenopathy bilaterally.  ABDOMEN: Soft, no distention noted.  No tenderness, rebound or guarding.  PELVIC: Normal appearing external genitalia and urethral meatus; normal appearing vaginal mucosa and cervix.  No abnormal discharge noted.  Pap smear obtained. Contact bleeding. Uuterine size difficult to evaluate due to body habitus, no other palpable masses, no uterine or adnexal tenderness.  .   Assessment and Plan:    1. Well woman exam with routine gynecological exam  Pap:Will follow up results of pap smear and manage accordingly. Mammogram : n/a  Labs: none Refills: none Referral: none Routine preventative health maintenance measures emphasized. Please refer to After Visit Summary for other counseling recommendations.      10/22/2020, CNM [facility],  [facility]

## 2020-11-05 LAB — CYTOLOGY - PAP
Comment: NEGATIVE
Diagnosis: NEGATIVE
High risk HPV: NEGATIVE

## 2021-11-09 ENCOUNTER — Encounter: Payer: Self-pay | Admitting: Certified Nurse Midwife

## 2022-01-13 ENCOUNTER — Encounter: Payer: Self-pay | Admitting: Certified Nurse Midwife
